# Patient Record
Sex: Female | Born: 1975 | Race: White | Hispanic: No | Marital: Married | Smoking: Never smoker
Health system: Southern US, Community
[De-identification: ages and names within clinical notes are randomized; demographics above are authoritative.]

## PROBLEM LIST (undated history)

## (undated) DIAGNOSIS — E042 Nontoxic multinodular goiter: Secondary | ICD-10-CM

## (undated) HISTORY — PX: DENTAL SURGERY: SHX609

## (undated) HISTORY — PX: DILATION AND CURETTAGE OF UTERUS: SHX78

## (undated) HISTORY — PX: CHOLECYSTECTOMY: SHX55

---

## 2009-11-21 ENCOUNTER — Inpatient Hospital Stay (HOSPITAL_COMMUNITY): Admission: AD | Admit: 2009-11-21 | Discharge: 2009-11-23 | Payer: Self-pay | Admitting: Obstetrics and Gynecology

## 2010-05-23 ENCOUNTER — Encounter: Admission: RE | Admit: 2010-05-23 | Discharge: 2010-05-23 | Payer: Self-pay | Admitting: Family Medicine

## 2010-10-08 LAB — CBC
Platelets: 175 10*3/uL (ref 150–400)
RBC: 3.23 MIL/uL — ABNORMAL LOW (ref 3.87–5.11)
WBC: 6.2 10*3/uL (ref 4.0–10.5)
WBC: 9.7 10*3/uL (ref 4.0–10.5)

## 2010-10-08 LAB — HEPATITIS B SURFACE ANTIGEN: Hepatitis B Surface Ag: NEGATIVE

## 2010-10-08 LAB — RPR: RPR Ser Ql: NONREACTIVE

## 2011-05-16 ENCOUNTER — Other Ambulatory Visit: Payer: Self-pay | Admitting: Family Medicine

## 2011-05-16 DIAGNOSIS — E041 Nontoxic single thyroid nodule: Secondary | ICD-10-CM

## 2011-05-23 ENCOUNTER — Ambulatory Visit
Admission: RE | Admit: 2011-05-23 | Discharge: 2011-05-23 | Disposition: A | Discharge status: Home / Self Care | Payer: 59 | Source: Ambulatory Visit | Attending: Family Medicine | Admitting: Family Medicine

## 2011-05-23 DIAGNOSIS — E041 Nontoxic single thyroid nodule: Secondary | ICD-10-CM

## 2012-03-08 ENCOUNTER — Other Ambulatory Visit: Payer: Self-pay | Admitting: Obstetrics and Gynecology

## 2012-03-08 MED ORDER — NORGESTIM-ETH ESTRAD TRIPHASIC 0.18/0.215/0.25 MG-25 MCG PO TABS: 1.0000 | ORAL_TABLET | Freq: Every day | ORAL | Status: DC

## 2012-03-08 NOTE — Telephone Encounter (Signed)
Spoke with pt rgd msg pt want refill on bc trisprintec until aex 04/21/12 per protocol rx sent to pharm pt voice understandong

## 2012-04-04 ENCOUNTER — Other Ambulatory Visit: Payer: Self-pay | Admitting: Obstetrics and Gynecology

## 2012-04-05 ENCOUNTER — Other Ambulatory Visit: Payer: Self-pay

## 2012-04-21 ENCOUNTER — Ambulatory Visit (INDEPENDENT_AMBULATORY_CARE_PROVIDER_SITE_OTHER): Payer: 59 | Admitting: Obstetrics and Gynecology

## 2012-04-21 ENCOUNTER — Encounter: Payer: Self-pay | Admitting: Obstetrics and Gynecology

## 2012-04-21 VITALS — BP 102/62 | HR 68 | Resp 16 | Ht 66.0 in | Wt 146.0 lb

## 2012-04-21 DIAGNOSIS — E042 Nontoxic multinodular goiter: Secondary | ICD-10-CM | POA: Insufficient documentation

## 2012-04-21 DIAGNOSIS — Z124 Encounter for screening for malignant neoplasm of cervix: Secondary | ICD-10-CM

## 2012-04-21 DIAGNOSIS — Z309 Encounter for contraceptive management, unspecified: Secondary | ICD-10-CM

## 2012-04-21 DIAGNOSIS — Z882 Allergy status to sulfonamides status: Secondary | ICD-10-CM | POA: Insufficient documentation

## 2012-04-21 DIAGNOSIS — IMO0001 Reserved for inherently not codable concepts without codable children: Secondary | ICD-10-CM | POA: Insufficient documentation

## 2012-04-21 MED ORDER — NORGESTIM-ETH ESTRAD TRIPHASIC 0.18/0.215/0.25 MG-35 MCG PO TABS: 1.0000 | ORAL_TABLET | Freq: Every day | ORAL | Status: AC

## 2012-04-21 NOTE — Progress Notes (Signed)
Regular Periods: yes Mammogram: no  Monthly Breast Ex.: yes Exercise: no  Tetanus < 10 years: yes Seatbelts: yes  NI. Bladder Functn.: yes Abuse at home: no  Daily BM's: yes Stressful Work: no  Healthy Diet: yes Sigmoid-Colonoscopy: None  Calcium: no Medical problems this year: None   LAST PAP:04/18/2011 WNL  Contraception: Trisprintec BC PILL  Mammogram:  None  PCP: [place]  PMH: None  FMH: None  Last Bone Scan: None

## 2012-04-21 NOTE — Progress Notes (Signed)
Subjective:    Alejandra Stewart is a 36 y.o. female, No obstetric history on file., who presents for an annual exam. Wants to continue TriSprintec.   Children in pre-K, kindergarten, and 4th grade.  Patient reports:  No issues.    History   Social History  . Marital Status: Married    Spouse Name: N/A    Number of Children: N/A  . Years of Education: N/A   Social History Main Topics  . Smoking status: Never Smoker   . Smokeless tobacco: None  . Alcohol Use: Yes  . Drug Use: No  . Sexually Active: Yes    Birth Control/ Protection: Pill   Other Topics Concern  . None   Social History Narrative  . None    Menstrual cycle:   LMP: Patient's last menstrual period was 03/31/2012.           Cycle: Normal  The following portions of the patient's history were reviewed and updated as appropriate: allergies, current medications, past family history, past medical history, past social history, past surgical history and problem list.  Review of Systems Pertinent items are noted in HPI. Breast:Negative for breast lump,nipple discharge or nipple retraction Gastrointestinal: Negative for abdominal pain, change in bowel habits or rectal bleeding Urinary:negative   Objective:    BP 102/62  Pulse 68  Resp 16  Ht 5\' 6"  (1.676 m)  Wt 146 lb (66.225 kg)  BMI 23.56 kg/m2  LMP 03/31/2012    Weight:  Wt Readings from Last 1 Encounters:  04/21/12 146 lb (66.225 kg)          BMI: Body mass index is 23.56 kg/(m^2).  General Appearance: Alert, appropriate appearance for age. No acute distress HEENT: Grossly normal Neck / Thyroid: Supple, no masses, nodes or enlargement Lungs: clear to auscultation bilaterally Back: No CVA tenderness Breast Exam: No masses or nodes.No dimpling, nipple retraction or discharge. Cardiovascular: Regular rate and rhythm. S1, S2, no murmur Gastrointestinal: Soft, non-tender, no masses or organomegaly Pelvic Exam: Vulva and vagina appear normal. Bimanual exam  reveals normal uterus and adnexa. Rectovaginal: normal rectal, no masses Lymphatic Exam: Non-palpable nodes in neck, clavicular, axillary, or inguinal regions Skin: no rash or abnormalities Neurologic: Normal gait and speech, no tremor  Psychiatric: Alert and oriented, appropriate affect.   Wet Prep:not applicable Urinalysis:not applicable UPT: Not done   Assessment:    Normal gyn exam  Contraceptive management--wants to continue TriSprintec   Plan:    Mammogram: Age 20 Pap:  Done today STD screening: declined Contraception:oral contraceptives (estrogen/progesterone) Other:  Rx Trisprintec x 1 year      Nikia Levels, [place], [place]

## 2012-04-22 LAB — PAP IG W/ RFLX HPV ASCU

## 2012-05-19 ENCOUNTER — Other Ambulatory Visit: Payer: Self-pay | Admitting: Family Medicine

## 2012-05-19 DIAGNOSIS — E041 Nontoxic single thyroid nodule: Secondary | ICD-10-CM

## 2012-06-02 ENCOUNTER — Other Ambulatory Visit: Payer: 59

## 2012-06-21 ENCOUNTER — Ambulatory Visit
Admission: RE | Admit: 2012-06-21 | Discharge: 2012-06-21 | Disposition: A | Discharge status: Home / Self Care | Payer: 59 | Source: Ambulatory Visit | Attending: Family Medicine | Admitting: Family Medicine

## 2012-06-21 DIAGNOSIS — E041 Nontoxic single thyroid nodule: Secondary | ICD-10-CM

## 2014-07-25 ENCOUNTER — Other Ambulatory Visit: Payer: Self-pay | Admitting: Physician Assistant

## 2014-07-25 DIAGNOSIS — E041 Nontoxic single thyroid nodule: Secondary | ICD-10-CM

## 2014-08-03 ENCOUNTER — Ambulatory Visit
Admission: RE | Admit: 2014-08-03 | Discharge: 2014-08-03 | Disposition: A | Discharge status: Home / Self Care | Payer: 59 | Source: Ambulatory Visit | Attending: Physician Assistant | Admitting: Physician Assistant

## 2014-08-03 DIAGNOSIS — E041 Nontoxic single thyroid nodule: Secondary | ICD-10-CM

## 2014-09-06 ENCOUNTER — Other Ambulatory Visit (INDEPENDENT_AMBULATORY_CARE_PROVIDER_SITE_OTHER): Payer: Self-pay | Admitting: Surgery

## 2014-09-06 ENCOUNTER — Other Ambulatory Visit (INDEPENDENT_AMBULATORY_CARE_PROVIDER_SITE_OTHER): Payer: Self-pay | Admitting: *Deleted

## 2014-09-06 DIAGNOSIS — E041 Nontoxic single thyroid nodule: Secondary | ICD-10-CM

## 2014-09-14 ENCOUNTER — Ambulatory Visit
Admission: RE | Admit: 2014-09-14 | Discharge: 2014-09-14 | Disposition: A | Discharge status: Home / Self Care | Payer: 59 | Source: Ambulatory Visit | Attending: Surgery | Admitting: Surgery

## 2014-09-14 ENCOUNTER — Other Ambulatory Visit (HOSPITAL_COMMUNITY)
Admission: RE | Admit: 2014-09-14 | Discharge: 2014-09-14 | Disposition: A | Discharge status: Home / Self Care | Payer: 59 | Source: Ambulatory Visit | Attending: Interventional Radiology | Admitting: Interventional Radiology

## 2014-09-14 DIAGNOSIS — E041 Nontoxic single thyroid nodule: Secondary | ICD-10-CM | POA: Diagnosis present

## 2014-10-02 ENCOUNTER — Ambulatory Visit (INDEPENDENT_AMBULATORY_CARE_PROVIDER_SITE_OTHER): Payer: Self-pay | Admitting: Surgery

## 2014-10-26 ENCOUNTER — Encounter (HOSPITAL_COMMUNITY): Payer: Self-pay

## 2014-10-26 ENCOUNTER — Encounter (HOSPITAL_COMMUNITY)
Admission: RE | Admit: 2014-10-26 | Discharge: 2014-10-26 | Disposition: A | Discharge status: Home / Self Care | Payer: 59 | Source: Ambulatory Visit | Attending: Surgery | Admitting: Surgery

## 2014-10-26 DIAGNOSIS — Z01812 Encounter for preprocedural laboratory examination: Secondary | ICD-10-CM | POA: Insufficient documentation

## 2014-10-26 HISTORY — DX: Nontoxic multinodular goiter: E04.2

## 2014-10-26 LAB — CBC
HCT: 38.9 % (ref 36.0–46.0)
Hemoglobin: 13.1 g/dL (ref 12.0–15.0)
MCH: 30.8 pg (ref 26.0–34.0)
MCHC: 33.7 g/dL (ref 30.0–36.0)
MCV: 91.5 fL (ref 78.0–100.0)
PLATELETS: 218 10*3/uL (ref 150–400)
RBC: 4.25 MIL/uL (ref 3.87–5.11)
RDW: 12.6 % (ref 11.5–15.5)
WBC: 5 10*3/uL (ref 4.0–10.5)

## 2014-10-26 LAB — HCG, SERUM, QUALITATIVE: Preg, Serum: NEGATIVE

## 2014-10-26 NOTE — Pre-Procedure Instructions (Signed)
Alejandra Stewart  10/26/2014   Your procedure is scheduled on:  April 18th, Monday   Report to [facility] Admitting at  9:00 AM.  Call this number if you have problems the morning of surgery: [phone]   Remember:   Do not eat food or drink liquids after midnight Sunday.   Take these medicines the morning of surgery with A SIP OF WATER: Nothing   Do not wear jewelry, make-up or nail polish.  Do not wear lotions, powders, or perfumes. You may NOT wear deodorant the day of surgery.  Do not shave underarms & legs 48 hours prior to surgery.    Do not bring valuables to the hospital.  [facility] is not responsible for any belongings or valuables.               Contacts, dentures or bridgework may not be worn into surgery.  Leave suitcase in the car. After surgery it may be brought to your room.  For patients admitted to the hospital, discharge time is determined by your treatment team.    Name and phone number of your driver:    Special Instructions: "Preparing for Surgery" instruction sheet.   Please read over the following fact sheets that you were given: Pain Booklet and Surgical Site Infection Prevention

## 2014-10-26 NOTE — Progress Notes (Signed)
No cardiac complaints, never has seen a cardiologist.

## 2014-11-15 ENCOUNTER — Encounter (HOSPITAL_COMMUNITY): Payer: Self-pay | Admitting: *Deleted

## 2014-11-15 DIAGNOSIS — D440 Neoplasm of uncertain behavior of thyroid gland: Secondary | ICD-10-CM | POA: Diagnosis present

## 2014-11-15 NOTE — H&P (Signed)
  General Surgery [facility]  Alejandra Stewart DOB: 1976/06/26 Married / Language: English / Race: White Female  History of Present Illness   The patient is a 39 year old female who presents with a thyroid nodule. Patient returns at my request to discuss the results of her fine-needle aspiration biopsies. 2 nodules in the left thyroid lobe underwent FNA aspiration. Cytopathology shows follicular lesions with cytologic atypia which are quite cellular. One specimen contains Hurthle cells. Both are Bethesda category III lesions. Patient notes some tenderness and bruising following the fine-needle aspiration procedure. That has now resolved. She returns today to discuss the pathology results.   Allergies Amoxicillin ER *PENICILLINS* Hives. Sulfa 10 *OPHTHALMIC AGENTS*  Medication History Ortho Tri-Cyclen (28) (0.18/0.215/0.25MG-35 MCG Tablet, Oral) Active. PreviDent 5000 Plus (1.1% Cream, Dental) Active. Medications Reconciled  Vitals 10/02/2014 9:25 AM Weight: 157.8 lb Height: 66in Body Surface Area: 1.83 m Body Mass Index: 25.47 kg/m Temp.: 98.39F  Pulse: 90 (Regular)  BP: 118/78 (Sitting, Left Arm, Standard)    Physical Exam  General - appears comfortable, no distress; not diaphorectic  HEENT - normocephalic; sclerae clear, gaze conjugate; mucous membranes moist, dentition good; voice normal  Neck - symmetric on extension; no palpable anterior or posterior cervical adenopathy; no palpable masses in the thyroid bed  Chest - clear bilaterally with rhonchi, rales, or wheeze  Cor - regular rhythm with normal rate; no significant murmur  Ext - non-tender without significant edema or lymphedema  Neuro - grossly intact; no tremor    Assessment & Plan  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 There is a nodule in the right  thyroid lobe which has not been sampled. Patient was previously provided with written literature on thyroid surgery to review at home.  I have recommended total thyroidectomy for definitive diagnosis and management. Patient and I discussed the procedure at length. We have discussed the surgical incision. We discussed possible complications including recurrent laryngeal nerve injury and injury to parathyroid glands. We have discussed the need for lifelong thyroid hormone replacement. We have discussed the hospital stay to be anticipated and the postoperative recovery. We have discussed the potential need for radioactive iodine treatment. Patient understands and wishes to proceed with surgery in the near future.  The risks and benefits of the procedure have been discussed at length with the patient. The patient understands the proposed procedure, potential alternative treatments, and the course of recovery to be expected. All of the patient's questions have been answered at this time. The patient wishes to proceed with surgery.  Earnstine Regal, MD, [facility] Office: [phone]

## 2014-11-16 ENCOUNTER — Observation Stay (HOSPITAL_COMMUNITY)
Admission: RE | Admit: 2014-11-16 | Discharge: 2014-11-17 | Disposition: A | Discharge status: Home / Self Care | Payer: 59 | Source: Ambulatory Visit | Attending: Surgery | Admitting: Surgery

## 2014-11-16 ENCOUNTER — Encounter (HOSPITAL_COMMUNITY): Payer: Self-pay | Admitting: *Deleted

## 2014-11-16 ENCOUNTER — Ambulatory Visit (HOSPITAL_COMMUNITY): Payer: 59 | Admitting: Anesthesiology

## 2014-11-16 ENCOUNTER — Ambulatory Visit (HOSPITAL_COMMUNITY): Payer: 59

## 2014-11-16 ENCOUNTER — Encounter (HOSPITAL_COMMUNITY)
Admission: RE | Disposition: A | Discharge status: Home / Self Care | Payer: Self-pay | Source: Ambulatory Visit | Attending: Surgery

## 2014-11-16 DIAGNOSIS — E042 Nontoxic multinodular goiter: Principal | ICD-10-CM | POA: Insufficient documentation

## 2014-11-16 DIAGNOSIS — Z01818 Encounter for other preprocedural examination: Secondary | ICD-10-CM

## 2014-11-16 DIAGNOSIS — E041 Nontoxic single thyroid nodule: Secondary | ICD-10-CM

## 2014-11-16 DIAGNOSIS — D440 Neoplasm of uncertain behavior of thyroid gland: Secondary | ICD-10-CM

## 2014-11-16 HISTORY — PX: THYROIDECTOMY: SHX17

## 2014-11-16 SURGERY — THYROIDECTOMY
Anesthesia: General | Site: Neck

## 2014-11-16 MED ORDER — HYDROCODONE-ACETAMINOPHEN 5-325 MG PO TABS
ORAL_TABLET | ORAL | Status: AC
  Filled 2014-11-16: qty 2

## 2014-11-16 MED ORDER — LACTATED RINGERS IV SOLN
INTRAVENOUS | Status: DC
  Administered 2014-11-16 (×2): via INTRAVENOUS

## 2014-11-16 MED ORDER — MIDAZOLAM HCL 2 MG/2ML IJ SOLN
INTRAMUSCULAR | Status: AC
  Filled 2014-11-16: qty 2

## 2014-11-16 MED ORDER — FENTANYL CITRATE (PF) 250 MCG/5ML IJ SOLN
INTRAMUSCULAR | Status: AC
  Filled 2014-11-16: qty 5

## 2014-11-16 MED ORDER — HYDROMORPHONE HCL 1 MG/ML IJ SOLN
INTRAMUSCULAR | Status: AC
  Filled 2014-11-16: qty 1

## 2014-11-16 MED ORDER — HYDROMORPHONE HCL 1 MG/ML IJ SOLN
0.2500 mg | INTRAMUSCULAR | Status: DC | PRN
  Administered 2014-11-16 (×4): 0.5 mg via INTRAVENOUS

## 2014-11-16 MED ORDER — LIDOCAINE HCL (CARDIAC) 20 MG/ML IV SOLN
INTRAVENOUS | Status: DC | PRN
  Administered 2014-11-16: 100 mg via INTRAVENOUS

## 2014-11-16 MED ORDER — HYDROMORPHONE HCL 1 MG/ML IJ SOLN
1.0000 mg | INTRAMUSCULAR | Status: DC | PRN
  Administered 2014-11-16 – 2014-11-17 (×4): 1 mg via INTRAVENOUS
  Filled 2014-11-16 (×3): qty 1

## 2014-11-16 MED ORDER — FENTANYL CITRATE (PF) 100 MCG/2ML IJ SOLN
INTRAMUSCULAR | Status: DC | PRN
  Administered 2014-11-16: 50 ug via INTRAVENOUS
  Administered 2014-11-16: 100 ug via INTRAVENOUS
  Administered 2014-11-16: 50 ug via INTRAVENOUS
  Administered 2014-11-16: 100 ug via INTRAVENOUS

## 2014-11-16 MED ORDER — GLYCOPYRROLATE 0.2 MG/ML IJ SOLN
INTRAMUSCULAR | Status: DC | PRN
  Administered 2014-11-16: .8 mg via INTRAVENOUS

## 2014-11-16 MED ORDER — HYDROCODONE-ACETAMINOPHEN 5-325 MG PO TABS
1.0000 | ORAL_TABLET | ORAL | Status: DC | PRN
Start: 2014-11-16 — End: 2014-11-17
  Administered 2014-11-16 – 2014-11-17 (×4): 2 via ORAL
  Filled 2014-11-16 (×3): qty 2

## 2014-11-16 MED ORDER — 0.9 % SODIUM CHLORIDE (POUR BTL) OPTIME
TOPICAL | Status: DC | PRN
  Administered 2014-11-16: 1000 mL

## 2014-11-16 MED ORDER — NEOSTIGMINE METHYLSULFATE 10 MG/10ML IV SOLN
INTRAVENOUS | Status: DC | PRN
  Administered 2014-11-16: 4 mg via INTRAVENOUS

## 2014-11-16 MED ORDER — HEMOSTATIC AGENTS (NO CHARGE) OPTIME
TOPICAL | Status: DC | PRN
  Administered 2014-11-16: 1 via TOPICAL

## 2014-11-16 MED ORDER — PROMETHAZINE HCL 25 MG/ML IJ SOLN
INTRAMUSCULAR | Status: AC
  Filled 2014-11-16: qty 1

## 2014-11-16 MED ORDER — ACETAMINOPHEN 325 MG PO TABS
650.0000 mg | ORAL_TABLET | ORAL | Status: DC | PRN
Start: 2014-11-16 — End: 2014-11-17

## 2014-11-16 MED ORDER — MIDAZOLAM HCL 5 MG/5ML IJ SOLN
INTRAMUSCULAR | Status: DC | PRN
  Administered 2014-11-16: 2 mg via INTRAVENOUS

## 2014-11-16 MED ORDER — PROMETHAZINE HCL 25 MG/ML IJ SOLN
6.2500 mg | INTRAMUSCULAR | Status: DC | PRN
  Administered 2014-11-16: 12.5 mg via INTRAVENOUS

## 2014-11-16 MED ORDER — CALCIUM CARBONATE 1250 (500 CA) MG PO TABS
2.0000 | ORAL_TABLET | Freq: Three times a day (TID) | ORAL | Status: DC
  Administered 2014-11-16 – 2014-11-17 (×3): 1000 mg via ORAL
  Filled 2014-11-16 (×3): qty 2

## 2014-11-16 MED ORDER — PROPOFOL 10 MG/ML IV BOLUS
INTRAVENOUS | Status: AC
  Filled 2014-11-16: qty 20

## 2014-11-16 MED ORDER — PROPOFOL 10 MG/ML IV BOLUS
INTRAVENOUS | Status: DC | PRN
  Administered 2014-11-16: 150 mg via INTRAVENOUS

## 2014-11-16 MED ORDER — ONDANSETRON HCL 4 MG/2ML IJ SOLN
INTRAMUSCULAR | Status: DC | PRN
  Administered 2014-11-16: 4 mg via INTRAVENOUS

## 2014-11-16 MED ORDER — KCL IN DEXTROSE-NACL 20-5-0.45 MEQ/L-%-% IV SOLN
INTRAVENOUS | Status: DC
  Administered 2014-11-16: 19:00:00 via INTRAVENOUS
  Filled 2014-11-16 (×2): qty 1000

## 2014-11-16 MED ORDER — CIPROFLOXACIN IN D5W 400 MG/200ML IV SOLN
400.0000 mg | INTRAVENOUS | Status: AC
  Administered 2014-11-16: 400 mg via INTRAVENOUS
  Filled 2014-11-16: qty 200

## 2014-11-16 MED ORDER — DEXAMETHASONE SODIUM PHOSPHATE 10 MG/ML IJ SOLN
INTRAMUSCULAR | Status: DC | PRN
  Administered 2014-11-16: 8 mg via INTRAVENOUS

## 2014-11-16 MED ORDER — ONDANSETRON HCL 4 MG PO TABS: 4.0000 mg | ORAL_TABLET | Freq: Four times a day (QID) | ORAL | Status: DC | PRN

## 2014-11-16 MED ORDER — ROCURONIUM BROMIDE 100 MG/10ML IV SOLN
INTRAVENOUS | Status: DC | PRN
  Administered 2014-11-16: 40 mg via INTRAVENOUS

## 2014-11-16 MED ORDER — ONDANSETRON HCL 4 MG/2ML IJ SOLN: 4.0000 mg | Freq: Four times a day (QID) | INTRAMUSCULAR | Status: DC | PRN

## 2014-11-16 SURGICAL SUPPLY — 49 items
ATTRACTOMAT 16X20 MAGNETIC DRP (DRAPES) ×2 IMPLANT
BENZOIN TINCTURE PRP APPL 2/3 (GAUZE/BANDAGES/DRESSINGS) ×2 IMPLANT
BLADE SURG 10 STRL SS (BLADE) ×2 IMPLANT
BLADE SURG 15 STRL LF DISP TIS (BLADE) ×1 IMPLANT
BLADE SURG 15 STRL SS (BLADE) ×1
BLADE SURG ROTATE 9660 (MISCELLANEOUS) IMPLANT
CANISTER SUCTION 2500CC (MISCELLANEOUS) ×2 IMPLANT
CHLORAPREP W/TINT 10.5 ML (MISCELLANEOUS) ×2 IMPLANT
CLIP TI MEDIUM 24 (CLIP) ×2 IMPLANT
CLIP TI WIDE RED SMALL 24 (CLIP) ×2 IMPLANT
CONT SPEC 4OZ CLIKSEAL STRL BL (MISCELLANEOUS) IMPLANT
COVER SURGICAL LIGHT HANDLE (MISCELLANEOUS) ×2 IMPLANT
CRADLE DONUT ADULT HEAD (MISCELLANEOUS) ×2 IMPLANT
DRAPE PED LAPAROTOMY (DRAPES) ×2 IMPLANT
DRAPE UTILITY XL STRL (DRAPES) ×2 IMPLANT
ELECT CAUTERY BLADE 6.4 (BLADE) ×2 IMPLANT
ELECT REM PT RETURN 9FT ADLT (ELECTROSURGICAL) ×2
ELECTRODE REM PT RTRN 9FT ADLT (ELECTROSURGICAL) ×1 IMPLANT
GAUZE SPONGE 4X4 12PLY STRL (GAUZE/BANDAGES/DRESSINGS) ×2 IMPLANT
GAUZE SPONGE 4X4 16PLY XRAY LF (GAUZE/BANDAGES/DRESSINGS) ×2 IMPLANT
GLOVE BIO SURGEON STRL SZ7.5 (GLOVE) ×4 IMPLANT
GLOVE BIOGEL PI IND STRL 7.5 (GLOVE) ×1 IMPLANT
GLOVE BIOGEL PI INDICATOR 7.5 (GLOVE) ×1
GLOVE SURG ORTHO 8.0 STRL STRW (GLOVE) ×2 IMPLANT
GOWN STRL REUS W/ TWL LRG LVL3 (GOWN DISPOSABLE) ×2 IMPLANT
GOWN STRL REUS W/ TWL XL LVL3 (GOWN DISPOSABLE) ×1 IMPLANT
GOWN STRL REUS W/TWL LRG LVL3 (GOWN DISPOSABLE) ×2
GOWN STRL REUS W/TWL XL LVL3 (GOWN DISPOSABLE) ×1
HEMOSTAT SURGICEL 2X4 FIBR (HEMOSTASIS) ×2 IMPLANT
KIT BASIN OR (CUSTOM PROCEDURE TRAY) ×2 IMPLANT
KIT ROOM TURNOVER OR (KITS) ×2 IMPLANT
LIQUID BAND (GAUZE/BANDAGES/DRESSINGS) ×2 IMPLANT
NS IRRIG 1000ML POUR BTL (IV SOLUTION) ×2 IMPLANT
PACK SURGICAL SETUP 50X90 (CUSTOM PROCEDURE TRAY) ×2 IMPLANT
PAD ARMBOARD 7.5X6 YLW CONV (MISCELLANEOUS) ×2 IMPLANT
PENCIL BUTTON HOLSTER BLD 10FT (ELECTRODE) ×2 IMPLANT
SHEARS HARMONIC 9CM CVD (BLADE) ×2 IMPLANT
SPECIMEN JAR MEDIUM (MISCELLANEOUS) IMPLANT
SPONGE GAUZE 4X4 12PLY STER LF (GAUZE/BANDAGES/DRESSINGS) ×2 IMPLANT
SPONGE INTESTINAL PEANUT (DISPOSABLE) ×2 IMPLANT
STRIP CLOSURE SKIN 1/2X4 (GAUZE/BANDAGES/DRESSINGS) ×2 IMPLANT
SUT MNCRL AB 4-0 PS2 18 (SUTURE) ×2 IMPLANT
SUT SILK 2 0 (SUTURE) ×1
SUT SILK 2-0 18XBRD TIE 12 (SUTURE) ×1 IMPLANT
SUT VIC AB 3-0 SH 18 (SUTURE) ×2 IMPLANT
SYR BULB 3OZ (MISCELLANEOUS) ×4 IMPLANT
TOWEL OR 17X24 6PK STRL BLUE (TOWEL DISPOSABLE) ×2 IMPLANT
TOWEL OR 17X26 10 PK STRL BLUE (TOWEL DISPOSABLE) ×2 IMPLANT
TUBE CONNECTING 12X1/4 (SUCTIONS) ×2 IMPLANT

## 2014-11-16 NOTE — Anesthesia Procedure Notes (Signed)
Procedure Name: Intubation Date/Time: 11/16/2014 9:44 AM Performed by: Neldon Newport Pre-anesthesia Checklist: Patient being monitored, Suction available, Emergency Drugs available, Patient identified and Timeout performed Patient Re-evaluated:Patient Re-evaluated prior to inductionOxygen Delivery Method: Circle system utilized Preoxygenation: Pre-oxygenation with 100% oxygen Intubation Type: IV induction Ventilation: Mask ventilation without difficulty Laryngoscope Size: Mac and 3 Grade View: Grade I Tube type: Oral Tube size: 7.0 mm Number of attempts: 1 Placement Confirmation: positive ETCO2,  ETT inserted through vocal cords under direct vision and breath sounds checked- equal and bilateral Secured at: 22 cm Tube secured with: Tape Dental Injury: Teeth and Oropharynx as per pre-operative assessment

## 2014-11-16 NOTE — Interval H&P Note (Signed)
History and Physical Interval Note:  11/16/2014 9:11 AM  Alejandra Stewart  has presented today for surgery, with the diagnosis of THYROID NEOPLASM OF UNCERTAIN BEHAVIOR.  The various methods of treatment have been discussed with the patient and family. After consideration of risks, benefits and other options for treatment, the patient has consented to    Procedure(s): TOTAL THYROIDECTOMY (N/A) as a surgical intervention .    The patient's history has been reviewed, patient examined, no change in status, stable for surgery.  I have reviewed the patient's chart and labs.  Questions were answered to the patient's satisfaction.    Earnstine Regal, MD, [facility] Office: [place]

## 2014-11-16 NOTE — Op Note (Signed)
Procedure Note  Pre-operative Diagnosis:  Bilateral thyroid nodules, thyroid neoplasm of uncertain behavior  Post-operative Diagnosis:  same  Surgeon:  Earnstine Regal, MD, FACS  Assistant:  Sharyn Dross, RNFA   Procedure:  Total thyroidectomy  Anesthesia:  General  Estimated Blood Loss:  minimal  Drains: none         Specimen: thyroid to pathology  Indications:  The patient is a 39 year old female who presents with a thyroid nodule. Patient returns at my request to discuss the results of her fine-needle aspiration biopsies. 2 nodules in the left thyroid lobe underwent FNA aspiration. Cytopathology shows follicular lesions with cytologic atypia which are quite cellular. One specimen contains Hurthle cells. Both are Bethesda category III lesions.   Procedure Details: Procedure was done in OR #8 at the [facility].  The patient was brought to the operating room and placed in a supine position on the operating room table.  Following administration of general anesthesia, the patient was positioned and then prepped and draped in the usual aseptic fashion.  After ascertaining that an adequate level of anesthesia had been achieved, a Kocher incision was made with #15 blade.  Dissection was carried through subcutaneous tissues and platysma. Hemostasis was achieved with the electrocautery.  Skin flaps were elevated cephalad and caudad from the thyroid notch to the sternal notch.  The Mahorner self-retaining retractor was placed for exposure.  Strap muscles were incised in the midline and dissection was begun on the left side.  Strap muscles were reflected laterally.  Left thyroid lobe was nodular but normal in size.  The left lobe was gently mobilized with blunt dissection.  Superior pole vessels were dissected out and divided individually between small and medium Ligaclips with the Harmonic scalpel.  The thyroid lobe was rolled anteriorly.  Branches of the inferior thyroid artery were  divided between small Ligaclips with the Harmonic scalpel.  Inferior venous tributaries were divided between Ligaclips.  Both the superior and inferior parathyroid glands were identified and preserved on their vascular pedicles.  The recurrent laryngeal nerve was identified and preserved along its course.  The ligament of Gwenlyn Found was released with the electrocautery and the gland was mobilized onto the anterior trachea. Isthmus was mobilized across the midline.  There was a thin pyramidal lobe present.  This was resected en bloc with the isthmus.  Dry pack was placed in the left neck.  Next, the right thyroid lobe was gently mobilized with blunt dissection.  Right thyroid lobe was nodular but normal in size.  Superior pole vessels were dissected out and divided between small and medium Ligaclips with the Harmonic scalpel.  Superior parathyroid was identified and preserved.  Inferior venous tributaries were divided between medium Ligaclips with the Harmonic scalpel.  The right thyroid lobe was rolled anteriorly and the branches of the inferior thyroid artery divided between small Ligaclips.  The right recurrent laryngeal nerve was identified and preserved along its course.  The ligament of Gwenlyn Found was released with the electrocautery.  The right thyroid lobe was mobilized onto the anterior trachea and the remainder of the thyroid was dissected off the anterior trachea and the thyroid was completely excised.  A suture was used to mark the left superior pole. The entire thyroid gland was submitted to pathology for review.  The neck was irrigated with warm saline.  Fibular was placed throughout the operative field.  Strap muscles were reapproximated in the midline with interrupted 3-0 Vicryl sutures.  Platysma was closed  with interrupted 3-0 Vicryl sutures.  Skin was closed with a running 4-0 Monocryl subcuticular suture.  Wound was washed and dried and benzoin and steri-strips were applied.  Dry gauze dressing was  placed.  The patient was awakened from anesthesia and brought to the recovery room.  The patient tolerated the procedure well.   Earnstine Regal, MD, [facility] Office: [phone]

## 2014-11-16 NOTE — Transfer of Care (Signed)
Immediate Anesthesia Transfer of Care Note  Patient: Alejandra Stewart  Procedure(s) Performed: Procedure(s): TOTAL THYROIDECTOMY (N/A)  Patient Location: PACU  Anesthesia Type:General  Level of Consciousness: awake and alert   Airway & Oxygen Therapy: Patient Spontanous Breathing and Patient connected to nasal cannula oxygen  Post-op Assessment: Report given to RN and Post -op Vital signs reviewed and stable  Post vital signs: Reviewed and stable  Last Vitals:  Filed Vitals:   11/16/14 0802  BP: 132/79  Pulse: 88  Temp: 36.7 C  Resp: 20    Complications: No apparent anesthesia complications

## 2014-11-16 NOTE — Anesthesia Preprocedure Evaluation (Addendum)
Anesthesia Evaluation  Patient identified by MRN, date of birth, ID band Patient awake    Reviewed: Allergy & Precautions, H&P , NPO status , Patient's Chart, lab work & pertinent test results  Airway Mallampati: II  TM Distance: <3 FB Neck ROM: full    Dental no notable dental hx. (+) Teeth Intact, Dental Advidsory Given   Pulmonary neg pulmonary ROS,  breath sounds clear to auscultation  Pulmonary exam normal       Cardiovascular negative cardio ROS  Rhythm:Regular Rate:Normal     Neuro/Psych negative neurological ROS     GI/Hepatic negative GI ROS, Neg liver ROS,   Endo/Other  Thyroid history noted. CE  Renal/GU negative Renal ROS  negative genitourinary   Musculoskeletal   Abdominal Normal abdominal exam  (+)   Peds  Hematology   Anesthesia Other Findings   Reproductive/Obstetrics                            Anesthesia Physical Anesthesia Plan  ASA: II  Anesthesia Plan: General   Post-op Pain Management:    Induction: Intravenous  Airway Management Planned: Oral ETT  Additional Equipment:   Intra-op Plan:   Post-operative Plan: Extubation in OR  Informed Consent: I have reviewed the patients History and Physical, chart, labs and discussed the procedure including the risks, benefits and alternatives for the proposed anesthesia with the patient or authorized representative who has indicated his/her understanding and acceptance.   Dental Advisory Given and Dental advisory given  Plan Discussed with: Anesthesiologist, CRNA and Surgeon  Anesthesia Plan Comments:        Anesthesia Quick Evaluation

## 2014-11-16 NOTE — Anesthesia Postprocedure Evaluation (Signed)
  Anesthesia Post-op Note  Patient: Alejandra Stewart  Procedure(s) Performed: Procedure(s): TOTAL THYROIDECTOMY (N/A)  Patient Location: PACU  Anesthesia Type:General  Level of Consciousness: awake  Airway and Oxygen Therapy: Patient Spontanous Breathing  Post-op Pain: mild  Post-op Assessment: Post-op Vital signs reviewed  Post-op Vital Signs: Reviewed  Last Vitals:  Filed Vitals:   11/16/14 1200  BP: 118/73  Pulse: 104  Temp:   Resp: 21    Complications: No apparent anesthesia complications

## 2014-11-17 ENCOUNTER — Encounter (HOSPITAL_COMMUNITY): Payer: Self-pay | Admitting: Surgery

## 2014-11-17 DIAGNOSIS — E042 Nontoxic multinodular goiter: Secondary | ICD-10-CM | POA: Diagnosis not present

## 2014-11-17 LAB — BASIC METABOLIC PANEL
ANION GAP: 6 (ref 5–15)
CALCIUM: 8.1 mg/dL — AB (ref 8.4–10.5)
CO2: 28 mmol/L (ref 19–32)
Chloride: 104 mmol/L (ref 96–112)
Creatinine, Ser: 0.63 mg/dL (ref 0.50–1.10)
GLUCOSE: 111 mg/dL — AB (ref 70–99)
Potassium: 3.6 mmol/L (ref 3.5–5.1)
Sodium: 138 mmol/L (ref 135–145)

## 2014-11-17 MED ORDER — SYNTHROID 100 MCG PO TABS: 100.0000 ug | ORAL_TABLET | Freq: Every day | ORAL | Status: AC

## 2014-11-17 MED ORDER — CALCIUM CARBONATE 1250 (500 CA) MG PO TABS: 2.0000 | ORAL_TABLET | Freq: Three times a day (TID) | ORAL | Status: DC

## 2014-11-17 MED ORDER — SODIUM CHLORIDE 0.9 % IV SOLN
2.0000 g | INTRAVENOUS | Status: AC
  Administered 2014-11-17: 2 g via INTRAVENOUS
  Filled 2014-11-17: qty 20

## 2014-11-17 MED ORDER — OXYCODONE HCL 5 MG PO TABS: 5.0000 mg | ORAL_TABLET | Freq: Four times a day (QID) | ORAL | Status: DC | PRN

## 2014-11-17 NOTE — Progress Notes (Signed)
UR completed 

## 2014-11-17 NOTE — Progress Notes (Signed)
Quick Note:  Please contact patient and notify of benign pathology results.  Earnstine Regal, MD, [facility] Office: [phone]   ______

## 2014-11-17 NOTE — Discharge Summary (Signed)
Physician Discharge Summary [facility]  Patient ID: Alejandra Stewart MRN: [id number] DOB/AGE: [birth year] 38 y.o.  Admit date: 11/16/2014 Discharge date: 11/17/2014  Admission Diagnoses:  Thyroid neoplasm of uncertain behavior  Discharge Diagnoses:  Principal Problem:   Neoplasm of uncertain behavior of thyroid gland   Discharged Condition: good  Hospital Course: Patient was admitted for observation following thyroid surgery.  Post op course was uncomplicated.  Pain was well controlled.  Tolerated diet.  Post op calcium level on morning following surgery was 8.1 mg/dl.  Calcium gluconate 2 gm was given IV prior to discharge.  Patient was prepared for discharge home on POD#1.  Consults: None  Treatments: surgery: total thyroidectomy  Discharge Exam: Blood pressure 106/74, pulse 84, temperature 98.4 F (36.9 C), temperature source Oral, resp. rate 18, height 5' 6"  (1.676 m), weight 71.215 kg (157 lb), last menstrual period 11/06/2014, SpO2 100 %. HEENT - clear Neck - wound dry and intact; voice normal; mild soft tissue swelling Chest - clear bilaterally Cor - RRR  Disposition: Home  Discharge Instructions    Apply dressing    Complete by:  As directed   Apply light gauze dressing to wound before discharge home today.     Diet - low sodium heart healthy    Complete by:  As directed      Discharge instructions    Complete by:  As directed   [facility]  THYROID & PARATHYROID SURGERY:  POST-OP INSTRUCTIONS  Always review your discharge instruction sheet from the facility where your surgery was performed.  A prescription for pain medication may be given to you upon discharge.  Take your pain medication as prescribed.  If narcotic pain medicine is not needed, then you may take acetaminophen (Tylenol) or ibuprofen (Advil) as needed.  Take your usually prescribed medications unless otherwise directed.  If you need a refill on your pain  medication, please contact your pharmacy. They will contact our office to request authorization.  Prescriptions will not be processed by our office after 5 pm or on weekends.  Start with a light diet upon arrival home, such as soup and crackers or toast.  Be sure to drink plenty of fluids daily.  Resume your normal diet the day after surgery.  Most patients will experience some swelling and bruising on the chest and neck area.  Ice packs will help.  Swelling and bruising can take several days to resolve.   It is common to experience some constipation after surgery.  Increasing fluid intake and taking a stool softener will usually help or prevent this problem.  A mild laxative (Milk of Magnesia or Miralax) should be taken according to package directions if there has been no bowel movement after 48 hours.  You have steri-strips and a gauze dressing over your incision.  You may remove the gauze bandage on the second day after surgery, and you may shower at that time.  Leave your steri-strips (small skin tapes) in place directly over the incision.  These strips should remain on the skin for 5-7 days and then be removed.  You may get them wet in the shower and pat them dry.  You may resume regular (light) daily activities beginning the next day - such as daily self-care, walking, climbing stairs - gradually increasing activities as tolerated.  You may have sexual intercourse when it is comfortable.  Refrain from any heavy lifting or straining until approved by your doctor.  You may drive when  you no longer are taking prescription pain medication, you can comfortably wear a seatbelt, and you can safely maneuver your car and apply brakes.  You should see your doctor in the office for a follow-up appointment approximately two to three weeks after your surgery.  Make sure that you call for this appointment within a day or two after you arrive home to insure a convenient appointment time.  WHEN TO CALL YOUR  DOCTOR: -- Fever greater than 101.5 -- Inability to urinate -- Nausea and/or vomiting - persistent -- Extreme swelling or bruising -- Continued bleeding from incision -- Increased pain, redness, or drainage from the incision -- Difficulty swallowing or breathing -- Muscle cramping or spasms -- Numbness or tingling in hands or around lips  The clinic staff is available to answer your questions during regular business hours.  Please don't hesitate to call and ask to speak to one of the nurses if you have concerns.  Earnstine Regal, MD, [facility] Office: [phone]  Website: [email or web address]     Increase activity slowly    Complete by:  As directed      Remove dressing in 24 hours    Complete by:  As directed             Medication List    TAKE these medications        acetaminophen 500 MG tablet  Commonly known as:  TYLENOL  Take 500-1,000 mg by mouth every 6 (six) hours as needed for mild pain or moderate pain.     calcium carbonate 1250 (500 CA) MG tablet  Commonly known as:  OS-CAL - dosed in mg of elemental calcium  Take 2 tablets (1,000 mg of elemental calcium total) by mouth 3 (three) times daily.     Norgestimate-Ethinyl Estradiol Triphasic 0.18/0.215/0.25 MG-35 MCG tablet  Commonly known as:  TRI-SPRINTEC  Take 1 tablet by mouth daily.     oxyCODONE 5 MG immediate release tablet  Commonly known as:  Oxy IR/ROXICODONE  Take 1-2 tablets (5-10 mg total) by mouth every 6 (six) hours as needed for moderate pain.     SYNTHROID 100 MCG tablet  Generic drug:  levothyroxine  Take 1 tablet (100 mcg total) by mouth daily.           Follow-up Information    Follow up with Earnstine Regal, MD. Schedule an appointment as soon as possible for a visit in 3 weeks.   Specialty:  General Surgery   Why:  For wound re-check   Contact information:   [place]  [phone]       Earnstine Regal, MD, [facility] Office: [phone]   Signed: Earnstine Regal 11/17/2014, 10:08 AM

## 2015-12-05 DIAGNOSIS — E890 Postprocedural hypothyroidism: Secondary | ICD-10-CM | POA: Diagnosis not present

## 2015-12-12 DIAGNOSIS — E890 Postprocedural hypothyroidism: Secondary | ICD-10-CM | POA: Diagnosis not present

## 2015-12-14 DIAGNOSIS — M5417 Radiculopathy, lumbosacral region: Secondary | ICD-10-CM | POA: Diagnosis not present

## 2015-12-14 DIAGNOSIS — M6281 Muscle weakness (generalized): Secondary | ICD-10-CM | POA: Diagnosis not present

## 2015-12-14 DIAGNOSIS — M545 Low back pain: Secondary | ICD-10-CM | POA: Diagnosis not present

## 2015-12-14 DIAGNOSIS — S32000A Wedge compression fracture of unspecified lumbar vertebra, initial encounter for closed fracture: Secondary | ICD-10-CM | POA: Diagnosis not present

## 2015-12-18 ENCOUNTER — Other Ambulatory Visit: Payer: Self-pay | Admitting: Physician Assistant

## 2015-12-18 DIAGNOSIS — M5416 Radiculopathy, lumbar region: Secondary | ICD-10-CM

## 2015-12-25 ENCOUNTER — Ambulatory Visit
Admission: RE | Admit: 2015-12-25 | Discharge: 2015-12-25 | Disposition: A | Discharge status: Home / Self Care | Payer: BLUE CROSS/BLUE SHIELD | Source: Ambulatory Visit | Attending: Physician Assistant | Admitting: Physician Assistant

## 2015-12-25 DIAGNOSIS — M5416 Radiculopathy, lumbar region: Secondary | ICD-10-CM

## 2015-12-25 DIAGNOSIS — M5116 Intervertebral disc disorders with radiculopathy, lumbar region: Secondary | ICD-10-CM | POA: Diagnosis not present

## 2015-12-25 MED ORDER — GADOBENATE DIMEGLUMINE 529 MG/ML IV SOLN
14.0000 mL | Freq: Once | INTRAVENOUS | Status: AC | PRN
  Administered 2015-12-25: 14 mL via INTRAVENOUS

## 2016-03-21 DIAGNOSIS — M25571 Pain in right ankle and joints of right foot: Secondary | ICD-10-CM | POA: Diagnosis not present

## 2016-05-25 DIAGNOSIS — J209 Acute bronchitis, unspecified: Secondary | ICD-10-CM | POA: Diagnosis not present

## 2016-06-09 DIAGNOSIS — Z23 Encounter for immunization: Secondary | ICD-10-CM | POA: Diagnosis not present

## 2016-08-01 DIAGNOSIS — Z0000 Encounter for general adult medical examination without abnormal findings: Secondary | ICD-10-CM | POA: Diagnosis not present

## 2016-08-01 DIAGNOSIS — E039 Hypothyroidism, unspecified: Secondary | ICD-10-CM | POA: Diagnosis not present

## 2016-08-18 DIAGNOSIS — Z6825 Body mass index (BMI) 25.0-25.9, adult: Secondary | ICD-10-CM | POA: Diagnosis not present

## 2016-08-18 DIAGNOSIS — Z309 Encounter for contraceptive management, unspecified: Secondary | ICD-10-CM | POA: Diagnosis not present

## 2016-08-18 DIAGNOSIS — Z01419 Encounter for gynecological examination (general) (routine) without abnormal findings: Secondary | ICD-10-CM | POA: Diagnosis not present

## 2016-09-15 DIAGNOSIS — Z1231 Encounter for screening mammogram for malignant neoplasm of breast: Secondary | ICD-10-CM | POA: Diagnosis not present

## 2016-11-18 DIAGNOSIS — L308 Other specified dermatitis: Secondary | ICD-10-CM | POA: Diagnosis not present

## 2016-12-09 DIAGNOSIS — E890 Postprocedural hypothyroidism: Secondary | ICD-10-CM | POA: Diagnosis not present

## 2016-12-16 DIAGNOSIS — E890 Postprocedural hypothyroidism: Secondary | ICD-10-CM | POA: Diagnosis not present

## 2017-08-10 DIAGNOSIS — Z23 Encounter for immunization: Secondary | ICD-10-CM | POA: Diagnosis not present

## 2017-08-10 DIAGNOSIS — Z0000 Encounter for general adult medical examination without abnormal findings: Secondary | ICD-10-CM | POA: Diagnosis not present

## 2017-08-16 DIAGNOSIS — J069 Acute upper respiratory infection, unspecified: Secondary | ICD-10-CM | POA: Diagnosis not present

## 2017-08-16 DIAGNOSIS — R05 Cough: Secondary | ICD-10-CM | POA: Diagnosis not present

## 2017-08-17 DIAGNOSIS — Z01419 Encounter for gynecological examination (general) (routine) without abnormal findings: Secondary | ICD-10-CM | POA: Diagnosis not present

## 2017-08-17 DIAGNOSIS — Z6825 Body mass index (BMI) 25.0-25.9, adult: Secondary | ICD-10-CM | POA: Diagnosis not present

## 2017-08-17 DIAGNOSIS — Z3040 Encounter for surveillance of contraceptives, unspecified: Secondary | ICD-10-CM | POA: Diagnosis not present

## 2017-09-17 DIAGNOSIS — Z1231 Encounter for screening mammogram for malignant neoplasm of breast: Secondary | ICD-10-CM | POA: Diagnosis not present

## 2018-01-11 DIAGNOSIS — E890 Postprocedural hypothyroidism: Secondary | ICD-10-CM | POA: Diagnosis not present

## 2018-01-25 DIAGNOSIS — E890 Postprocedural hypothyroidism: Secondary | ICD-10-CM | POA: Diagnosis not present

## 2018-03-29 DIAGNOSIS — E890 Postprocedural hypothyroidism: Secondary | ICD-10-CM | POA: Diagnosis not present

## 2018-08-24 DIAGNOSIS — Z01419 Encounter for gynecological examination (general) (routine) without abnormal findings: Secondary | ICD-10-CM | POA: Diagnosis not present

## 2018-08-24 DIAGNOSIS — Z3040 Encounter for surveillance of contraceptives, unspecified: Secondary | ICD-10-CM | POA: Diagnosis not present

## 2018-08-24 DIAGNOSIS — Z124 Encounter for screening for malignant neoplasm of cervix: Secondary | ICD-10-CM | POA: Diagnosis not present

## 2018-08-24 DIAGNOSIS — Z6826 Body mass index (BMI) 26.0-26.9, adult: Secondary | ICD-10-CM | POA: Diagnosis not present

## 2018-09-02 DIAGNOSIS — Z833 Family history of diabetes mellitus: Secondary | ICD-10-CM | POA: Diagnosis not present

## 2018-09-02 DIAGNOSIS — E039 Hypothyroidism, unspecified: Secondary | ICD-10-CM | POA: Diagnosis not present

## 2018-09-02 DIAGNOSIS — Z0000 Encounter for general adult medical examination without abnormal findings: Secondary | ICD-10-CM | POA: Diagnosis not present

## 2018-09-02 DIAGNOSIS — Z131 Encounter for screening for diabetes mellitus: Secondary | ICD-10-CM | POA: Diagnosis not present

## 2018-09-21 DIAGNOSIS — Z1231 Encounter for screening mammogram for malignant neoplasm of breast: Secondary | ICD-10-CM | POA: Diagnosis not present

## 2019-01-13 DIAGNOSIS — R05 Cough: Secondary | ICD-10-CM | POA: Diagnosis not present

## 2019-01-14 DIAGNOSIS — R05 Cough: Secondary | ICD-10-CM | POA: Diagnosis not present

## 2019-01-31 DIAGNOSIS — E890 Postprocedural hypothyroidism: Secondary | ICD-10-CM | POA: Diagnosis not present

## 2019-02-03 DIAGNOSIS — E890 Postprocedural hypothyroidism: Secondary | ICD-10-CM | POA: Diagnosis not present

## 2019-03-21 DIAGNOSIS — M542 Cervicalgia: Secondary | ICD-10-CM | POA: Diagnosis not present

## 2019-05-09 DIAGNOSIS — E890 Postprocedural hypothyroidism: Secondary | ICD-10-CM | POA: Diagnosis not present

## 2019-05-17 DIAGNOSIS — Z23 Encounter for immunization: Secondary | ICD-10-CM | POA: Diagnosis not present

## 2019-09-12 DIAGNOSIS — E039 Hypothyroidism, unspecified: Secondary | ICD-10-CM | POA: Diagnosis not present

## 2019-09-12 DIAGNOSIS — Z13220 Encounter for screening for lipoid disorders: Secondary | ICD-10-CM | POA: Diagnosis not present

## 2019-09-12 DIAGNOSIS — Z0000 Encounter for general adult medical examination without abnormal findings: Secondary | ICD-10-CM | POA: Diagnosis not present

## 2019-09-12 DIAGNOSIS — M549 Dorsalgia, unspecified: Secondary | ICD-10-CM | POA: Diagnosis not present

## 2019-09-15 DIAGNOSIS — M542 Cervicalgia: Secondary | ICD-10-CM | POA: Diagnosis not present

## 2019-09-20 DIAGNOSIS — M542 Cervicalgia: Secondary | ICD-10-CM | POA: Diagnosis not present

## 2019-10-04 DIAGNOSIS — Z3040 Encounter for surveillance of contraceptives, unspecified: Secondary | ICD-10-CM | POA: Diagnosis not present

## 2019-10-04 DIAGNOSIS — Z1239 Encounter for other screening for malignant neoplasm of breast: Secondary | ICD-10-CM | POA: Diagnosis not present

## 2019-10-04 DIAGNOSIS — Z01419 Encounter for gynecological examination (general) (routine) without abnormal findings: Secondary | ICD-10-CM | POA: Diagnosis not present

## 2019-10-04 DIAGNOSIS — Z6826 Body mass index (BMI) 26.0-26.9, adult: Secondary | ICD-10-CM | POA: Diagnosis not present

## 2019-10-04 DIAGNOSIS — Z1231 Encounter for screening mammogram for malignant neoplasm of breast: Secondary | ICD-10-CM | POA: Diagnosis not present

## 2019-10-25 DIAGNOSIS — M542 Cervicalgia: Secondary | ICD-10-CM | POA: Diagnosis not present

## 2019-10-27 ENCOUNTER — Other Ambulatory Visit: Payer: Self-pay | Admitting: Surgery

## 2019-10-27 DIAGNOSIS — Z9889 Other specified postprocedural states: Secondary | ICD-10-CM

## 2019-11-02 ENCOUNTER — Ambulatory Visit
Admission: RE | Admit: 2019-11-02 | Discharge: 2019-11-02 | Disposition: A | Discharge status: Home / Self Care | Payer: Self-pay | Source: Ambulatory Visit | Attending: Surgery | Admitting: Surgery

## 2019-11-02 DIAGNOSIS — Z9889 Other specified postprocedural states: Secondary | ICD-10-CM

## 2019-11-24 DIAGNOSIS — M542 Cervicalgia: Secondary | ICD-10-CM | POA: Diagnosis not present

## 2019-11-24 DIAGNOSIS — S161XXD Strain of muscle, fascia and tendon at neck level, subsequent encounter: Secondary | ICD-10-CM | POA: Diagnosis not present

## 2019-12-01 DIAGNOSIS — M542 Cervicalgia: Secondary | ICD-10-CM | POA: Diagnosis not present

## 2019-12-01 DIAGNOSIS — S161XXD Strain of muscle, fascia and tendon at neck level, subsequent encounter: Secondary | ICD-10-CM | POA: Diagnosis not present

## 2019-12-06 DIAGNOSIS — S161XXD Strain of muscle, fascia and tendon at neck level, subsequent encounter: Secondary | ICD-10-CM | POA: Diagnosis not present

## 2019-12-06 DIAGNOSIS — M542 Cervicalgia: Secondary | ICD-10-CM | POA: Diagnosis not present

## 2019-12-08 DIAGNOSIS — S161XXD Strain of muscle, fascia and tendon at neck level, subsequent encounter: Secondary | ICD-10-CM | POA: Diagnosis not present

## 2019-12-08 DIAGNOSIS — M542 Cervicalgia: Secondary | ICD-10-CM | POA: Diagnosis not present

## 2020-01-31 DIAGNOSIS — E890 Postprocedural hypothyroidism: Secondary | ICD-10-CM | POA: Diagnosis not present

## 2020-02-07 DIAGNOSIS — E890 Postprocedural hypothyroidism: Secondary | ICD-10-CM | POA: Diagnosis not present

## 2020-03-24 DIAGNOSIS — M79642 Pain in left hand: Secondary | ICD-10-CM | POA: Diagnosis not present

## 2020-04-20 DIAGNOSIS — Z23 Encounter for immunization: Secondary | ICD-10-CM | POA: Diagnosis not present

## 2020-08-20 DIAGNOSIS — Z20828 Contact with and (suspected) exposure to other viral communicable diseases: Secondary | ICD-10-CM | POA: Diagnosis not present

## 2020-09-18 DIAGNOSIS — Z0000 Encounter for general adult medical examination without abnormal findings: Secondary | ICD-10-CM | POA: Diagnosis not present

## 2020-09-18 DIAGNOSIS — Z13220 Encounter for screening for lipoid disorders: Secondary | ICD-10-CM | POA: Diagnosis not present

## 2020-10-29 DIAGNOSIS — Z1231 Encounter for screening mammogram for malignant neoplasm of breast: Secondary | ICD-10-CM | POA: Diagnosis not present

## 2020-10-29 DIAGNOSIS — Z3040 Encounter for surveillance of contraceptives, unspecified: Secondary | ICD-10-CM | POA: Diagnosis not present

## 2020-10-29 DIAGNOSIS — Z01419 Encounter for gynecological examination (general) (routine) without abnormal findings: Secondary | ICD-10-CM | POA: Diagnosis not present

## 2020-11-14 DIAGNOSIS — L814 Other melanin hyperpigmentation: Secondary | ICD-10-CM | POA: Diagnosis not present

## 2020-11-14 DIAGNOSIS — D2372 Other benign neoplasm of skin of left lower limb, including hip: Secondary | ICD-10-CM | POA: Diagnosis not present

## 2020-11-14 DIAGNOSIS — D225 Melanocytic nevi of trunk: Secondary | ICD-10-CM | POA: Diagnosis not present

## 2020-11-14 DIAGNOSIS — L578 Other skin changes due to chronic exposure to nonionizing radiation: Secondary | ICD-10-CM | POA: Diagnosis not present

## 2021-01-06 DIAGNOSIS — M25572 Pain in left ankle and joints of left foot: Secondary | ICD-10-CM | POA: Diagnosis not present

## 2021-01-22 DIAGNOSIS — E890 Postprocedural hypothyroidism: Secondary | ICD-10-CM | POA: Diagnosis not present

## 2021-01-23 DIAGNOSIS — E890 Postprocedural hypothyroidism: Secondary | ICD-10-CM | POA: Diagnosis not present

## 2021-03-04 DIAGNOSIS — Z111 Encounter for screening for respiratory tuberculosis: Secondary | ICD-10-CM | POA: Diagnosis not present

## 2021-05-13 DIAGNOSIS — E890 Postprocedural hypothyroidism: Secondary | ICD-10-CM | POA: Diagnosis not present

## 2021-11-15 ENCOUNTER — Other Ambulatory Visit: Payer: Self-pay | Admitting: Orthopedic Surgery

## 2021-12-30 ENCOUNTER — Other Ambulatory Visit: Payer: Self-pay

## 2021-12-30 ENCOUNTER — Encounter (HOSPITAL_BASED_OUTPATIENT_CLINIC_OR_DEPARTMENT_OTHER): Payer: Self-pay | Admitting: Orthopedic Surgery

## 2022-01-04 IMAGING — US US THYROID
1 series · 14 of 16 positions shown · non-contrast
Comparison: Preop 09/14/2014

CLINICAL DATA: Anterior neck pain, previous thyroidectomy

EXAM:
THYROID ULTRASOUND
TECHNIQUE: Ultrasound examination of the thyroidectomy bed and adjacent soft
tissues was performed.

[Series 1: us thyroid · 0.06mm/px · 14 of 16 slices shown]
[im 1/16]
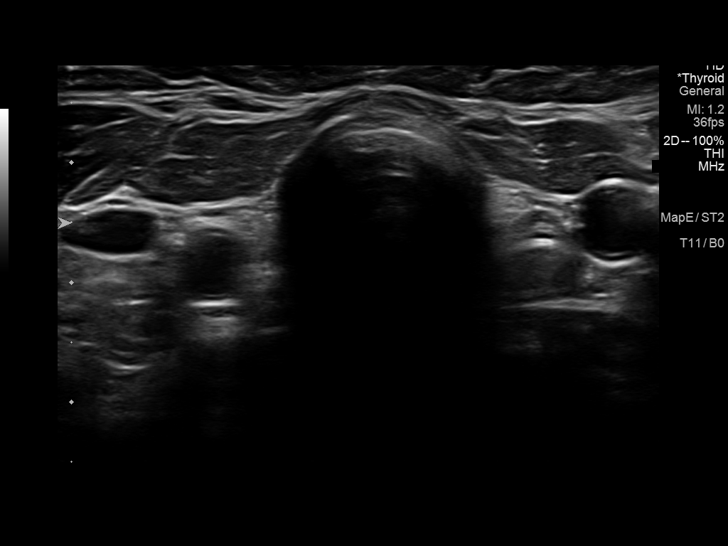
[im 2/16]
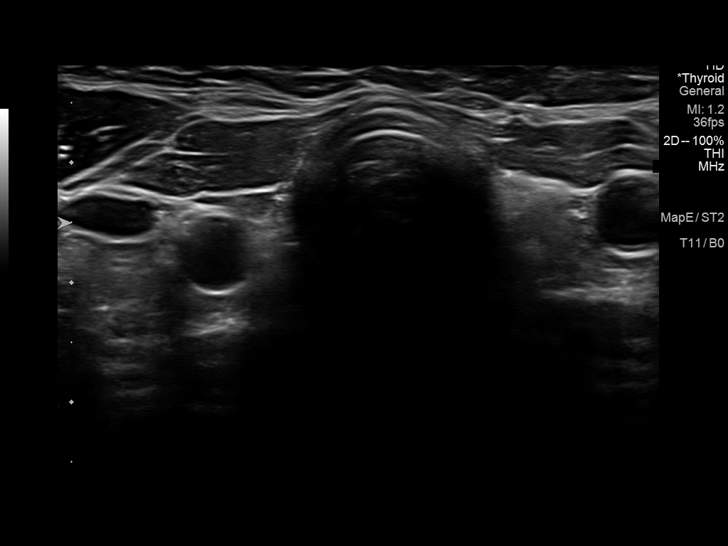
[im 3/16]
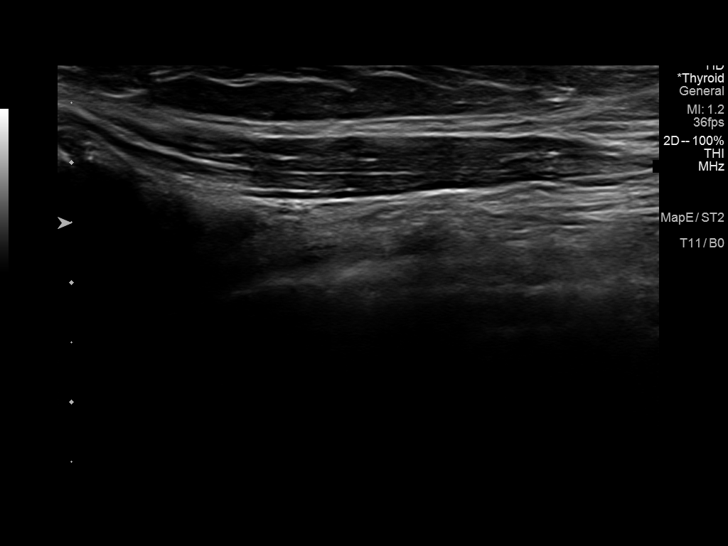
[im 5/16]
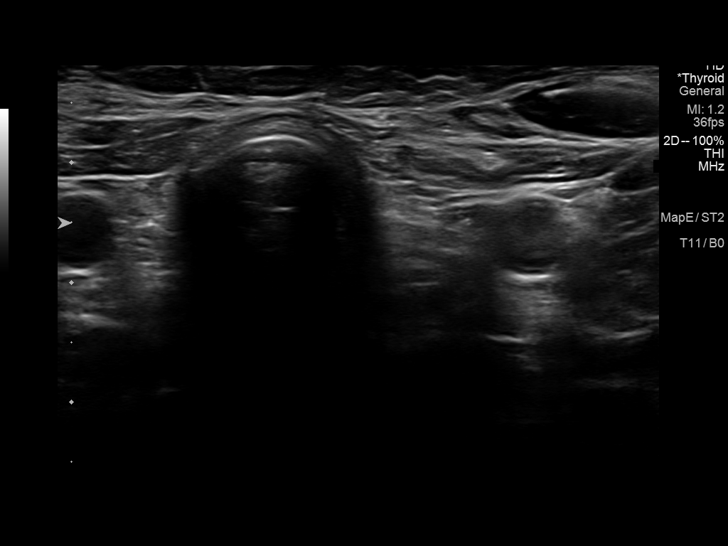
[im 6/16]
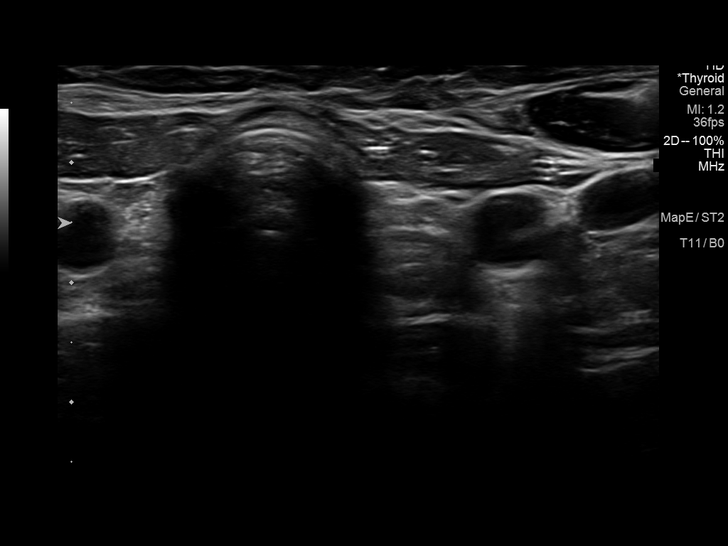
[im 7/16]
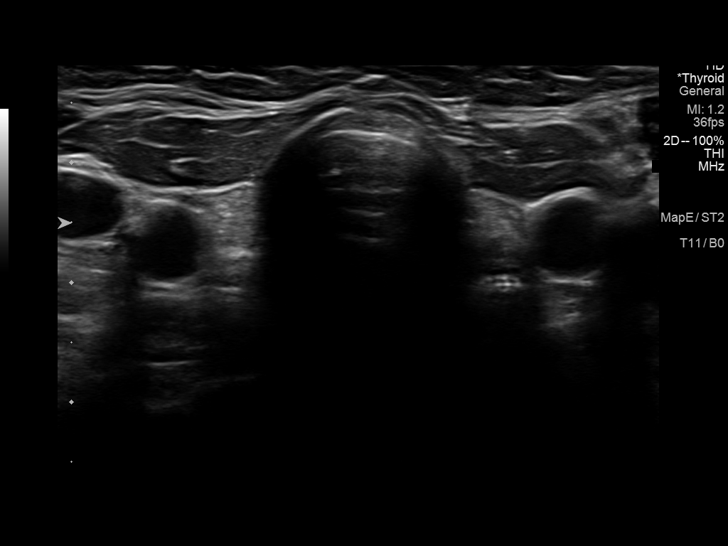
[im 8/16]
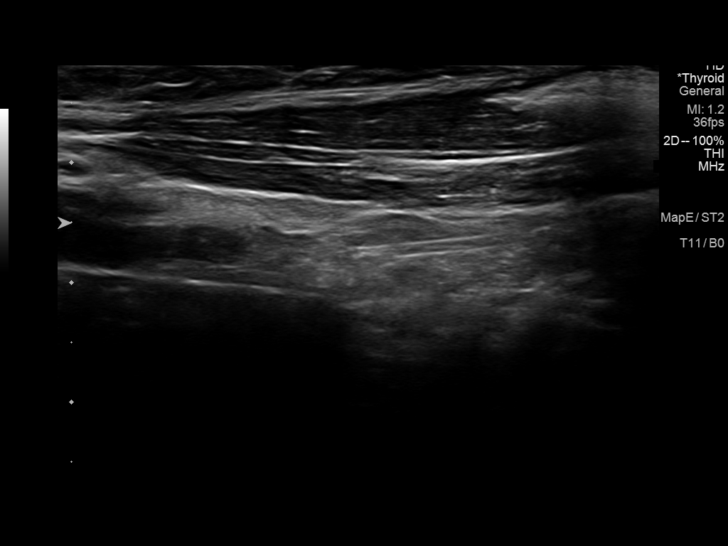
[im 9/16]
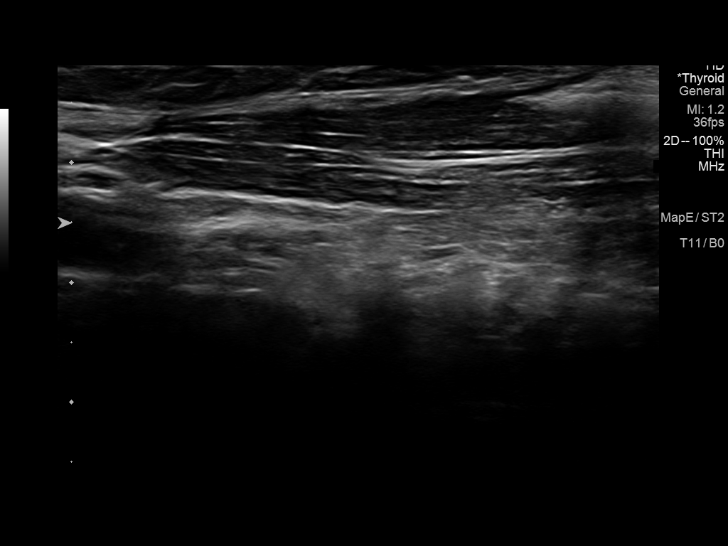
[im 10/16]
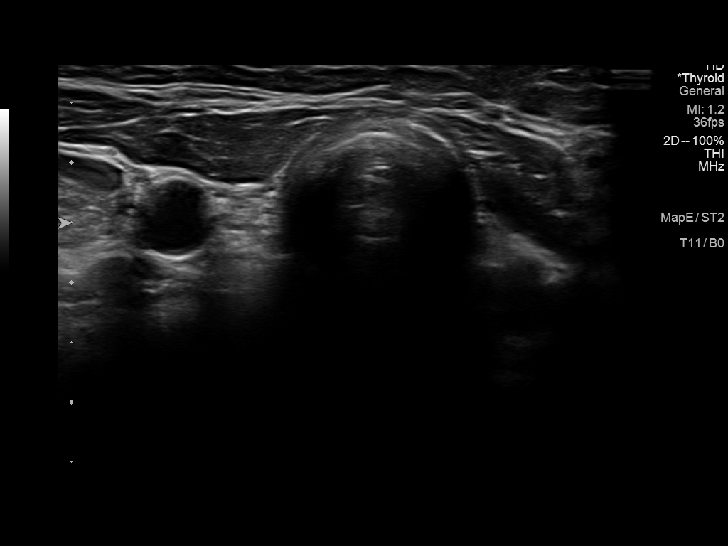
[im 11/16]
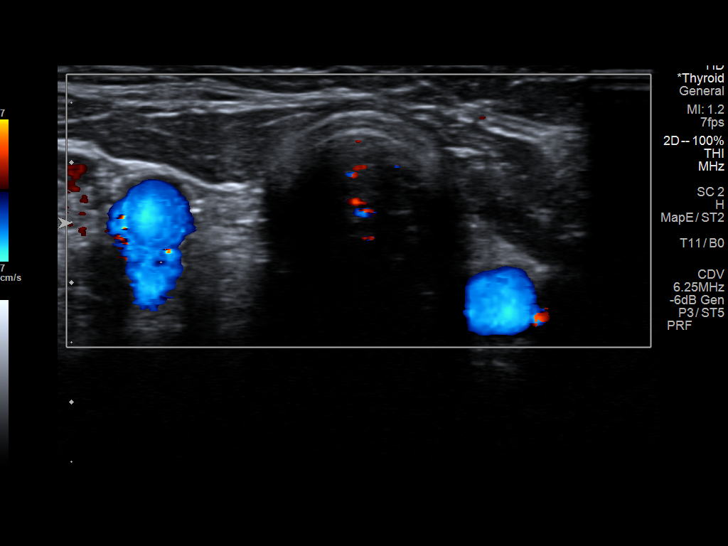
[im 13/16]
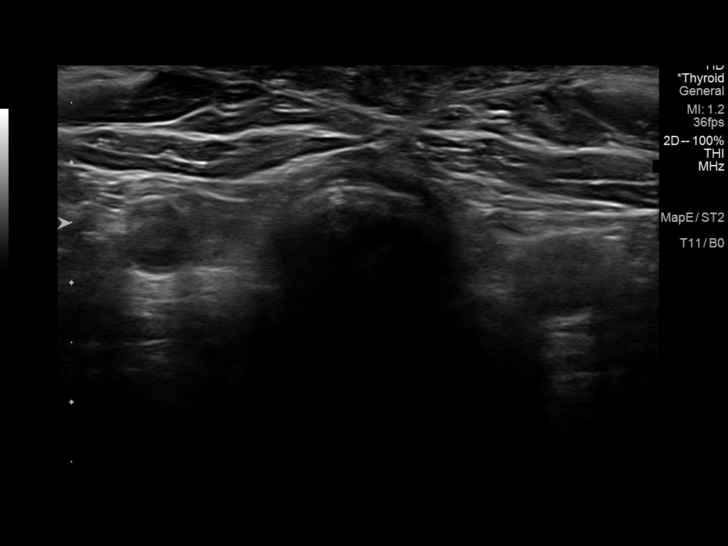
[im 14/16]
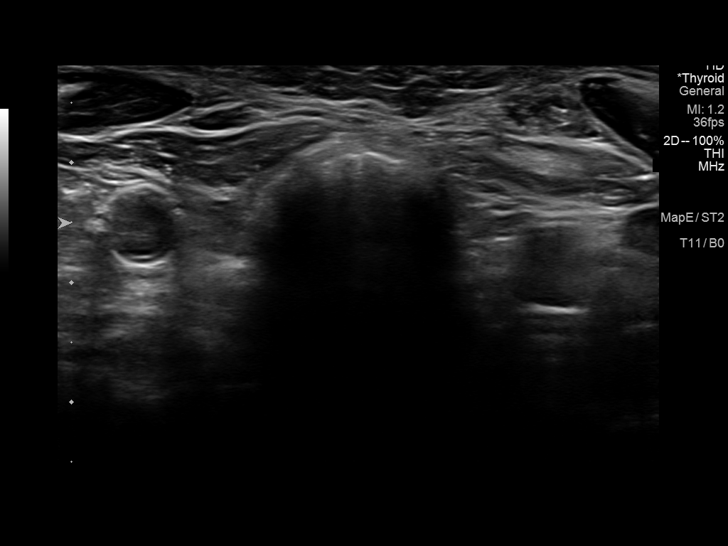
[im 15/16]
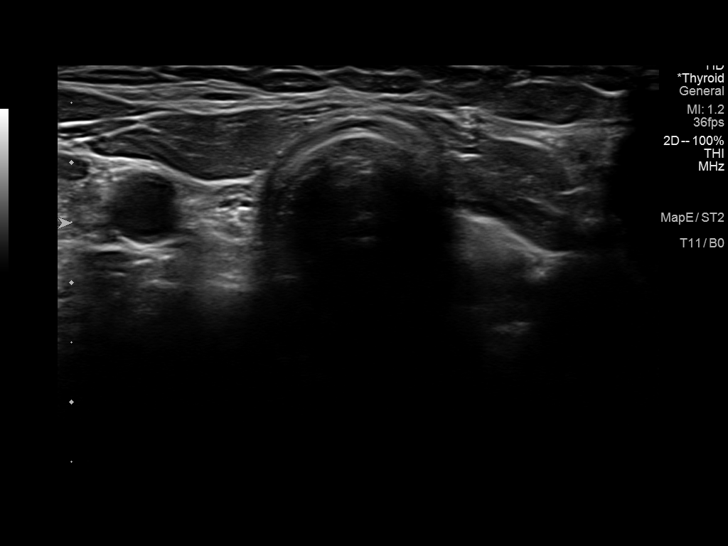
[im 16/16]
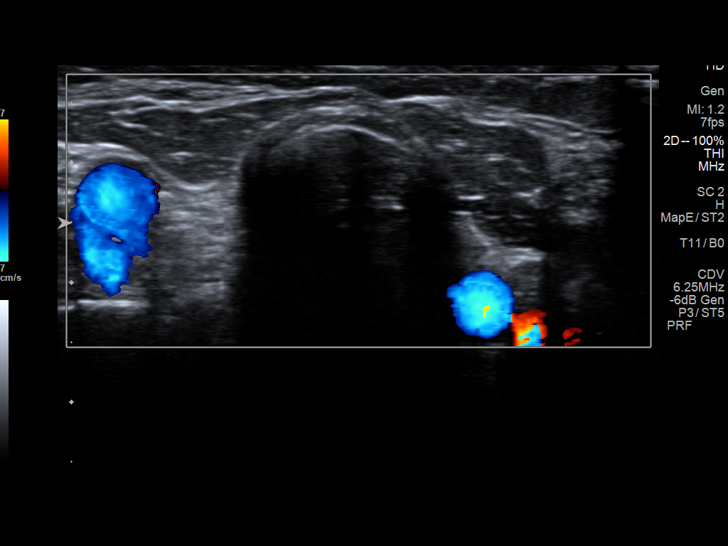

[14 of 16 positions shown; findings below may reference images not displayed]

FINDINGS: Parenchymal Echotexture: None identified

Isthmus: Surgically absent

Right lobe: Surgically absent

Left lobe: Surgically absent

_________________________________________________________

Estimated total number of nodules >/= 1 cm: 0

Number of spongiform nodules >/=  2 cm not described below (TR1): 0

Number of mixed cystic and solid nodules >/= 1.5 cm not described
below (TR2): 0

_________________________________________________________

No discrete nodules are seen within the thyroidectomy bed. No
regional adenopathy identified.
IMPRESSION: Negative for residual/recurrent tissue post thyroidectomy.

The above is in keeping with the ACR TI-RADS recommendations - [HOSPITAL] 2633;[DATE].

## 2022-01-07 ENCOUNTER — Other Ambulatory Visit: Payer: Self-pay

## 2022-01-07 ENCOUNTER — Ambulatory Visit (HOSPITAL_BASED_OUTPATIENT_CLINIC_OR_DEPARTMENT_OTHER)
Admission: RE | Admit: 2022-01-07 | Discharge: 2022-01-07 | Disposition: A | Discharge status: Home / Self Care | Payer: BC Managed Care – PPO | Attending: Orthopedic Surgery | Admitting: Orthopedic Surgery

## 2022-01-07 ENCOUNTER — Encounter (HOSPITAL_BASED_OUTPATIENT_CLINIC_OR_DEPARTMENT_OTHER)
Admission: RE | Disposition: A | Discharge status: Home / Self Care | Payer: Self-pay | Source: Home / Self Care | Attending: Orthopedic Surgery

## 2022-01-07 ENCOUNTER — Ambulatory Visit (HOSPITAL_BASED_OUTPATIENT_CLINIC_OR_DEPARTMENT_OTHER): Payer: BC Managed Care – PPO | Admitting: Certified Registered"

## 2022-01-07 ENCOUNTER — Encounter (HOSPITAL_BASED_OUTPATIENT_CLINIC_OR_DEPARTMENT_OTHER): Payer: Self-pay | Admitting: Orthopedic Surgery

## 2022-01-07 DIAGNOSIS — D1809 Hemangioma of other sites: Secondary | ICD-10-CM | POA: Diagnosis present

## 2022-01-07 DIAGNOSIS — E039 Hypothyroidism, unspecified: Secondary | ICD-10-CM | POA: Diagnosis not present

## 2022-01-07 DIAGNOSIS — Z01818 Encounter for other preprocedural examination: Secondary | ICD-10-CM

## 2022-01-07 HISTORY — PX: EXCISION METACARPAL MASS: SHX6372

## 2022-01-07 LAB — POCT PREGNANCY, URINE: Preg Test, Ur: NEGATIVE

## 2022-01-07 SURGERY — EXCISION METACARPAL MASS
Anesthesia: General | Laterality: Right

## 2022-01-07 MED ORDER — PROPOFOL 10 MG/ML IV BOLUS
INTRAVENOUS | Status: AC
  Filled 2022-01-07: qty 20

## 2022-01-07 MED ORDER — MIDAZOLAM HCL 2 MG/2ML IJ SOLN
INTRAMUSCULAR | Status: DC | PRN
  Administered 2022-01-07: 2 mg via INTRAVENOUS

## 2022-01-07 MED ORDER — BUPIVACAINE HCL (PF) 0.25 % IJ SOLN
INTRAMUSCULAR | Status: AC
  Filled 2022-01-07: qty 30

## 2022-01-07 MED ORDER — MIDAZOLAM HCL 2 MG/2ML IJ SOLN
INTRAMUSCULAR | Status: AC
  Filled 2022-01-07: qty 2

## 2022-01-07 MED ORDER — BUPIVACAINE HCL (PF) 0.25 % IJ SOLN
INTRAMUSCULAR | Status: DC | PRN
  Administered 2022-01-07: 9 mL

## 2022-01-07 MED ORDER — VANCOMYCIN HCL IN DEXTROSE 1-5 GM/200ML-% IV SOLN
1000.0000 mg | INTRAVENOUS | Status: AC
  Administered 2022-01-07: 1000 mg via INTRAVENOUS

## 2022-01-07 MED ORDER — ONDANSETRON HCL 4 MG/2ML IJ SOLN
INTRAMUSCULAR | Status: DC | PRN
  Administered 2022-01-07: 4 mg via INTRAVENOUS

## 2022-01-07 MED ORDER — FENTANYL CITRATE (PF) 100 MCG/2ML IJ SOLN
INTRAMUSCULAR | Status: AC
  Filled 2022-01-07: qty 2

## 2022-01-07 MED ORDER — KETOROLAC TROMETHAMINE 30 MG/ML IJ SOLN
30.0000 mg | Freq: Once | INTRAMUSCULAR | Status: AC
  Administered 2022-01-07: 30 mg via INTRAVENOUS

## 2022-01-07 MED ORDER — ACETAMINOPHEN 10 MG/ML IV SOLN: 1000.0000 mg | Freq: Once | INTRAVENOUS | Status: DC | PRN

## 2022-01-07 MED ORDER — VANCOMYCIN HCL IN DEXTROSE 1-5 GM/200ML-% IV SOLN
INTRAVENOUS | Status: AC
  Filled 2022-01-07: qty 200

## 2022-01-07 MED ORDER — LACTATED RINGERS IV SOLN: INTRAVENOUS | Status: DC | PRN

## 2022-01-07 MED ORDER — DEXAMETHASONE SODIUM PHOSPHATE 10 MG/ML IJ SOLN
INTRAMUSCULAR | Status: DC | PRN
  Administered 2022-01-07: 10 mg via INTRAVENOUS

## 2022-01-07 MED ORDER — DEXAMETHASONE SODIUM PHOSPHATE 10 MG/ML IJ SOLN
INTRAMUSCULAR | Status: AC
  Filled 2022-01-07: qty 1

## 2022-01-07 MED ORDER — AMISULPRIDE (ANTIEMETIC) 5 MG/2ML IV SOLN: 10.0000 mg | Freq: Once | INTRAVENOUS | Status: DC | PRN

## 2022-01-07 MED ORDER — ONDANSETRON HCL 4 MG/2ML IJ SOLN
INTRAMUSCULAR | Status: AC
  Filled 2022-01-07: qty 4

## 2022-01-07 MED ORDER — FENTANYL CITRATE (PF) 100 MCG/2ML IJ SOLN
INTRAMUSCULAR | Status: DC | PRN
Start: 2022-01-07 — End: 2022-01-07
  Administered 2022-01-07 (×2): 50 ug via INTRAVENOUS

## 2022-01-07 MED ORDER — OXYCODONE HCL 5 MG PO TABS
5.0000 mg | ORAL_TABLET | Freq: Once | ORAL | Status: AC | PRN
  Administered 2022-01-07: 5 mg via ORAL

## 2022-01-07 MED ORDER — KETOROLAC TROMETHAMINE 30 MG/ML IJ SOLN
INTRAMUSCULAR | Status: AC
Start: 2022-01-07 — End: ?
  Filled 2022-01-07: qty 1

## 2022-01-07 MED ORDER — FENTANYL CITRATE (PF) 100 MCG/2ML IJ SOLN: 25.0000 ug | INTRAMUSCULAR | Status: DC | PRN

## 2022-01-07 MED ORDER — OXYCODONE HCL 5 MG/5ML PO SOLN: 5.0000 mg | Freq: Once | ORAL | Status: AC | PRN

## 2022-01-07 MED ORDER — HYDROCODONE-ACETAMINOPHEN 5-325 MG PO TABS: ORAL_TABLET | ORAL | 0 refills | Status: AC

## 2022-01-07 MED ORDER — PROPOFOL 10 MG/ML IV BOLUS
INTRAVENOUS | Status: DC | PRN
  Administered 2022-01-07: 150 mg via INTRAVENOUS
  Administered 2022-01-07: 200 mg via INTRAVENOUS

## 2022-01-07 MED ORDER — DEXMEDETOMIDINE HCL IN NACL 200 MCG/50ML IV SOLN
INTRAVENOUS | Status: DC | PRN
  Administered 2022-01-07: 12 ug via INTRAVENOUS

## 2022-01-07 MED ORDER — LACTATED RINGERS IV SOLN: INTRAVENOUS | Status: DC

## 2022-01-07 MED ORDER — LIDOCAINE HCL (CARDIAC) PF 100 MG/5ML IV SOSY
PREFILLED_SYRINGE | INTRAVENOUS | Status: DC | PRN
  Administered 2022-01-07: 100 mg via INTRAVENOUS

## 2022-01-07 MED ORDER — OXYCODONE HCL 5 MG PO TABS
ORAL_TABLET | ORAL | Status: AC
  Filled 2022-01-07: qty 1

## 2022-01-07 SURGICAL SUPPLY — 56 items
APL PRP STRL LF DISP 70% ISPRP (MISCELLANEOUS) ×1
APL SKNCLS STERI-STRIP NONHPOA (GAUZE/BANDAGES/DRESSINGS)
BANDAGE GAUZE 1X75IN STRL (MISCELLANEOUS) IMPLANT
BENZOIN TINCTURE PRP APPL 2/3 (GAUZE/BANDAGES/DRESSINGS) IMPLANT
BLADE MINI RND TIP GREEN BEAV (BLADE) IMPLANT
BLADE SURG 15 STRL LF DISP TIS (BLADE) ×2 IMPLANT
BLADE SURG 15 STRL SS (BLADE) ×4
BNDG CMPR 5X2 CHSV 1 LYR STRL (GAUZE/BANDAGES/DRESSINGS)
BNDG CMPR 75X11 PLY HI ABS (MISCELLANEOUS)
BNDG CMPR 75X21 PLY HI ABS (MISCELLANEOUS)
BNDG CMPR 9X4 STRL LF SNTH (GAUZE/BANDAGES/DRESSINGS) ×1
BNDG COHESIVE 1X5 TAN STRL LF (GAUZE/BANDAGES/DRESSINGS) IMPLANT
BNDG COHESIVE 2X5 TAN ST LF (GAUZE/BANDAGES/DRESSINGS) IMPLANT
BNDG ELASTIC 2X5.8 VLCR STR LF (GAUZE/BANDAGES/DRESSINGS) IMPLANT
BNDG ELASTIC 3X5.8 VLCR STR LF (GAUZE/BANDAGES/DRESSINGS) IMPLANT
BNDG ESMARK 4X9 LF (GAUZE/BANDAGES/DRESSINGS) ×1 IMPLANT
BNDG GAUZE 1X75IN STRL (MISCELLANEOUS)
BNDG GAUZE DERMACEA FLUFF (GAUZE/BANDAGES/DRESSINGS)
BNDG GAUZE DERMACEA FLUFF 4 (GAUZE/BANDAGES/DRESSINGS) IMPLANT
BNDG GZE DERMACEA 4 6PLY (GAUZE/BANDAGES/DRESSINGS)
BNDG PLASTER X FAST 3X3 WHT LF (CAST SUPPLIES) IMPLANT
BNDG PLSTR 9X3 FST ST WHT (CAST SUPPLIES)
CHLORAPREP W/TINT 26 (MISCELLANEOUS) ×2 IMPLANT
CORD BIPOLAR FORCEPS 12FT (ELECTRODE) ×2 IMPLANT
COVER BACK TABLE 60X90IN (DRAPES) ×2 IMPLANT
COVER MAYO STAND STRL (DRAPES) ×2 IMPLANT
CUFF TOURN SGL QUICK 18X4 (TOURNIQUET CUFF) ×2 IMPLANT
DRAPE EXTREMITY T 121X128X90 (DISPOSABLE) ×2 IMPLANT
DRAPE SURG 17X23 STRL (DRAPES) ×2 IMPLANT
GAUZE SPONGE 4X4 12PLY STRL (GAUZE/BANDAGES/DRESSINGS) ×2 IMPLANT
GAUZE STRETCH 2X75IN STRL (MISCELLANEOUS) IMPLANT
GAUZE XEROFORM 1X8 LF (GAUZE/BANDAGES/DRESSINGS) ×2 IMPLANT
GLOVE BIO SURGEON STRL SZ7.5 (GLOVE) ×2 IMPLANT
GLOVE BIOGEL PI IND STRL 8 (GLOVE) ×1 IMPLANT
GLOVE BIOGEL PI INDICATOR 8 (GLOVE) ×1
GOWN STRL REUS W/ TWL LRG LVL3 (GOWN DISPOSABLE) ×1 IMPLANT
GOWN STRL REUS W/TWL LRG LVL3 (GOWN DISPOSABLE) ×2
GOWN STRL REUS W/TWL XL LVL3 (GOWN DISPOSABLE) ×2 IMPLANT
NDL HYPO 25X1 1.5 SAFETY (NEEDLE) ×1 IMPLANT
NEEDLE HYPO 25X1 1.5 SAFETY (NEEDLE) ×2 IMPLANT
NS IRRIG 1000ML POUR BTL (IV SOLUTION) ×2 IMPLANT
PACK BASIN DAY SURGERY FS (CUSTOM PROCEDURE TRAY) ×2 IMPLANT
PAD CAST 3X4 CTTN HI CHSV (CAST SUPPLIES) IMPLANT
PAD CAST 4YDX4 CTTN HI CHSV (CAST SUPPLIES) IMPLANT
PADDING CAST ABS 4INX4YD NS (CAST SUPPLIES) ×1
PADDING CAST ABS COTTON 4X4 ST (CAST SUPPLIES) ×1 IMPLANT
PADDING CAST COTTON 3X4 STRL (CAST SUPPLIES)
PADDING CAST COTTON 4X4 STRL (CAST SUPPLIES)
STOCKINETTE 4X48 STRL (DRAPES) ×2 IMPLANT
STRIP CLOSURE SKIN 1/2X4 (GAUZE/BANDAGES/DRESSINGS) IMPLANT
SUT ETHILON 3 0 PS 1 (SUTURE) IMPLANT
SUT ETHILON 4 0 PS 2 18 (SUTURE) ×2 IMPLANT
SYR BULB EAR ULCER 3OZ GRN STR (SYRINGE) ×2 IMPLANT
SYR CONTROL 10ML LL (SYRINGE) ×2 IMPLANT
TOWEL GREEN STERILE FF (TOWEL DISPOSABLE) ×4 IMPLANT
UNDERPAD 30X36 HEAVY ABSORB (UNDERPADS AND DIAPERS) ×2 IMPLANT

## 2022-01-07 NOTE — Anesthesia Procedure Notes (Signed)
Procedure Name: LMA Insertion Date/Time: 01/07/2022 1:14 PM  Performed by: Verita Lamb, CRNAPre-anesthesia Checklist: Patient identified, Emergency Drugs available, Suction available and Patient being monitored Patient Re-evaluated:Patient Re-evaluated prior to induction Oxygen Delivery Method: Circle system utilized Preoxygenation: Pre-oxygenation with 100% oxygen Induction Type: IV induction Ventilation: Mask ventilation without difficulty LMA: LMA inserted LMA Size: 4.0 Number of attempts: 1 Airway Equipment and Method: Bite block Placement Confirmation: positive ETCO2 Tube secured with: Tape Dental Injury: Teeth and Oropharynx as per pre-operative assessment

## 2022-01-07 NOTE — Anesthesia Preprocedure Evaluation (Addendum)
Anesthesia Evaluation    Reviewed: Allergy & Precautions, Patient's Chart, lab work & pertinent test results  Airway Mallampati: II  TM Distance: >3 FB Neck ROM: Full    Dental no notable dental hx.    Pulmonary neg pulmonary ROS,    Pulmonary exam normal        Cardiovascular negative cardio ROS Normal cardiovascular exam     Neuro/Psych negative neurological ROS     GI/Hepatic negative GI ROS, (+)     substance abuse  ,   Endo/Other  Hypothyroidism   Renal/GU negative Renal ROS     Musculoskeletal negative musculoskeletal ROS (+)   Abdominal   Peds  Hematology negative hematology ROS (+)   Anesthesia Other Findings SUBUNGUAL MASS RIGHT THUMB  Reproductive/Obstetrics hcg negative                             Anesthesia Physical Anesthesia Plan  ASA: 2  Anesthesia Plan: General   Post-op Pain Management:    Induction: Intravenous  PONV Risk Score and Plan: 3 and Ondansetron, Dexamethasone, Midazolam and Treatment may vary due to age or medical condition  Airway Management Planned: LMA  Additional Equipment:   Intra-op Plan:   Post-operative Plan: Extubation in OR  Informed Consent: I have reviewed the patients History and Physical, chart, labs and discussed the procedure including the risks, benefits and alternatives for the proposed anesthesia with the patient or authorized representative who has indicated his/her understanding and acceptance.     Dental advisory given  Plan Discussed with: CRNA  Anesthesia Plan Comments:        Anesthesia Quick Evaluation

## 2022-01-07 NOTE — Anesthesia Postprocedure Evaluation (Signed)
Anesthesia Post Note  Patient: Shyana Kulakowski  Procedure(s) Performed: EXCISION SUBUNGUAL MASS (GLOMUS TUMOR) RIGHT THUMB (Right)     Patient location during evaluation: PACU Anesthesia Type: General Level of consciousness: awake Pain management: pain level controlled Vital Signs Assessment: post-procedure vital signs reviewed and stable Respiratory status: spontaneous breathing, nonlabored ventilation, respiratory function stable and patient connected to nasal cannula oxygen Cardiovascular status: blood pressure returned to baseline and stable Postop Assessment: no apparent nausea or vomiting Anesthetic complications: no   No notable events documented.  Last Vitals:  Vitals:   01/07/22 1415 01/07/22 1440  BP: 125/87 128/88  Pulse: 79 88  Resp: 12 20  Temp:  36.5 C  SpO2: 98% 100%    Last Pain:  Vitals:   01/07/22 1440  TempSrc: Oral  PainSc: 3                  Skyanne Welle P Kaytlin Burklow

## 2022-01-07 NOTE — Discharge Instructions (Addendum)
  Post Anesthesia Home Care Instructions  Activity: Get plenty of rest for the remainder of the day. A responsible individual must stay with you for 24 hours following the procedure.  For the next 24 hours, DO NOT: -Drive a car -Paediatric nurse -Drink alcoholic beverages -Take any medication unless instructed by your physician -Make any legal decisions or sign important papers.  Meals: Start with liquid foods such as gelatin or soup. Progress to regular foods as tolerated. Avoid greasy, spicy, heavy foods. If nausea and/or vomiting occur, drink only clear liquids until the nausea and/or vomiting subsides. Call your physician if vomiting continues.  Special Instructions/Symptoms: Your throat may feel dry or sore from the anesthesia or the breathing tube placed in your throat during surgery. If this causes discomfort, gargle with warm salt water. The discomfort should disappear within 24 hours.  If you had a scopolamine patch placed behind your ear for the management of post- operative nausea and/or vomiting:  1. The medication in the patch is effective for 72 hours, after which it should be removed.  Wrap patch in a tissue and discard in the trash. Wash hands thoroughly with soap and water. 2. You may remove the patch earlier than 72 hours if you experience unpleasant side effects which may include dry mouth, dizziness or visual disturbances. 3. Avoid touching the patch. Wash your hands with soap and water after contact with the patch.        [facility] Instructions Hand Surgery  Wound Care: Keep your hand elevated above the level of your heart.  Do not allow it to dangle by your side.  Keep the dressing dry and do not remove it unless your doctor advises you to do so.  He will usually change it at the time of your post-op visit.  Moving your fingers is advised to stimulate circulation but will depend on the site of your surgery.  If you have a splint applied, your doctor will advise  you regarding movement.  Activity: Do not drive or operate machinery today.  Rest today and then you may return to your normal activity and work as indicated by your physician.  Diet:  Drink liquids today or eat a light diet.  You may resume a regular diet tomorrow.    General expectations: Pain for two to three days. Fingers may become slightly swollen.  Call your doctor if any of the following occur: Severe pain not relieved by pain medication. Elevated temperature. Dressing soaked with blood. Inability to move fingers. White or bluish color to fingers.

## 2022-01-07 NOTE — H&P (Signed)
  Alejandra Stewart is an 46 y.o. female.   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  This is bothersome to her.  She wishes to have it removed.  Allergies:  Allergies  Allergen Reactions   Amoxicillin Hives   Sulfa Antibiotics Other (See Comments)    Bruises    Past Medical History:  Diagnosis Date   Multiple thyroid nodules     Past Surgical History:  Procedure Laterality Date   CHOLECYSTECTOMY     2007   DENTAL SURGERY     2010   DILATION AND CURETTAGE OF UTERUS     2008   THYROIDECTOMY  11/16/2014   THYROIDECTOMY N/A 11/16/2014   Procedure: TOTAL THYROIDECTOMY;  Surgeon: Armandina Gemma, MD;  Location: [facility];  Service: General;  Laterality: N/A;    Family History: Family History  Problem Relation Age of Onset   Diabetes type II Mother     Social History:   reports that she has never smoked. She has never used smokeless tobacco. She reports current alcohol use of about 3.0 standard drinks of alcohol per week. She reports that she does not use drugs.  Medications: Medications Prior to Admission  Medication Sig Dispense Refill   acetaminophen (TYLENOL) 500 MG tablet Take 500-1,000 mg by mouth every 6 (six) hours as needed for mild pain or moderate pain.      ibuprofen (ADVIL) 400 MG tablet Take 400 mg by mouth every 6 (six) hours as needed.     Norgestimate-Ethinyl Estradiol Triphasic (TRI-SPRINTEC) 0.18/0.215/0.25 MG-35 MCG tablet Take 1 tablet by mouth daily. 1 Package 11   SYNTHROID 100 MCG tablet Take 1 tablet (100 mcg total) by mouth daily. (Patient taking differently: Take 137 mcg by mouth daily.) 30 tablet 3    Results for orders placed or performed during the hospital encounter of 01/07/22 (from the past 48 hour(s))  Pregnancy, urine POC     Status: None   Collection Time: 01/07/22 11:54 AM  Result Value Ref Range   Preg Test, Ur NEGATIVE NEGATIVE    Comment:        THE SENSITIVITY OF THIS METHODOLOGY IS >24  mIU/mL     No results found.    Blood pressure 133/88, pulse 80, temperature 98.9 F (37.2 C), temperature source Oral, resp. rate 16, height 5' 6"  (1.676 m), weight 72.3 kg, last menstrual period 12/25/2021, SpO2 100 %.  General appearance: alert, cooperative, and appears stated age Head: Normocephalic, without obvious abnormality, atraumatic Neck: supple, symmetrical, trachea midline Extremities: Intact sensation and capillary refill all digits.  +epl/fpl/io.  No wounds.  Pulses: 2+ and symmetric Skin: Skin color, texture, turgor normal. No rashes or lesions Neurologic: Grossly normal Incision/Wound: none  Assessment/Plan Right thumb glomus tumor.  Non operative and operative treatment options have been discussed with the patient and patient wishes to proceed with operative treatment. Risks, benefits, and alternatives of surgery have been discussed and the patient agrees with the plan of care.   Leanora Cover 01/07/2022, 12:14 PM

## 2022-01-07 NOTE — Transfer of Care (Signed)
Immediate Anesthesia Transfer of Care Note  Patient: Ellysia Char  Procedure(s) Performed: EXCISION SUBUNGUAL MASS (GLOMUS TUMOR) RIGHT THUMB (Right)  Patient Location: PACU  Anesthesia Type:General  Level of Consciousness: awake and patient cooperative  Airway & Oxygen Therapy: Patient Spontanous Breathing and Patient connected to face mask oxygen  Post-op Assessment: Report given to RN and Post -op Vital signs reviewed and stable  Post vital signs: Reviewed and stable  Last Vitals:  Vitals Value Taken Time  BP 86/59 01/07/22 1348  Temp    Pulse 69 01/07/22 1349  Resp 22 01/07/22 1349  SpO2 98 % 01/07/22 1349  Vitals shown include unvalidated device data.  Last Pain:  Vitals:   01/07/22 1203  TempSrc: Oral  PainSc: 0-No pain      Patients Stated Pain Goal: 1 (02/77/41 2878)  Complications: No notable events documented.

## 2022-01-07 NOTE — Op Note (Signed)
NAME: Alejandra Stewart MEDICAL RECORD NO: [id number] DATE OF BIRTH: [birth year] FACILITY: [facility] LOCATION: [facility] PHYSICIAN: Tennis Must, MD   OPERATIVE REPORT   DATE OF PROCEDURE: 01/07/22    PREOPERATIVE DIAGNOSIS: Right thumb subungual glomus tumor   POSTOPERATIVE DIAGNOSIS: Right thumb subungual glomus tumor   PROCEDURE: 1.  Right thumb removal of nail plate 2 right thumb excision of glomus tumor, 5 mm   SURGEON:  Leanora Cover, M.D.   ASSISTANT: none   ANESTHESIA:  General   INTRAVENOUS FLUIDS:  Per anesthesia flow sheet.   ESTIMATED BLOOD LOSS:  Minimal.   COMPLICATIONS:  None.   SPECIMENS: Right thumb mass to pathology   TOURNIQUET TIME:    Total Tourniquet Time Documented: Upper Arm (Right) - 22 minutes Total: Upper Arm (Right) - 22 minutes    DISPOSITION:  Stable to PACU.   INDICATIONS: 46 year old female with glomus tumor of right thumb.  This been shown on MRI.  She wishes to have it removed risks, benefits and alternatives of surgery were discussed including the risks of blood loss, infection, damage to nerves, vessels, tendons, ligaments, bone for surgery, need for additional surgery, complications with wound healing, continued pain, stiffness, , recurrence.  She voiced understanding of these risks and elected to proceed.  OPERATIVE COURSE:  After being identified preoperatively by myself,  the patient and I agreed on the procedure and site of the procedure.  The surgical site was marked.  Surgical consent had been signed. Preoperative IV antibiotic prophylaxis was given. She was transferred to the operating room and placed on the operating table in supine position with the Right upper extremity on an arm board.  General anesthesia was induced by the anesthesiologist.  Right upper extremity was prepped and draped in normal sterile orthopedic fashion.  A surgical pause was performed between the surgeons, anesthesia, and operating room staff  and all were in agreement as to the patient, procedure, and site of procedure.  Tourniquet at the proximal aspect of the extremity was inflated to 250 mmHg after exsanguination of the arm with an Esmarch bandage.  The nail plate was removed with a freer elevator.  A incision was made over the mass through the nailbed.  The mass was easily identified.  It was bulbous and pearly in nature.  It was carefully freed up from the undersurface of the nailbed using a Kleinert Coots.  It was freed up from surrounding nailbed and the bone underneath.  It was removed and sent to pathology for examination.  It measured 5 mm in diameter.  The area was examined and no remaining mass was noted.  The wound was closed with a 6-0 chromic suture in an interrupted fashion.  A piece of Xeroform was placed in the nail fold and the area dressed with sterile Xeroform and 4 x 4 and wrapped with a Coban dressing lightly.  An AlumaFoam splint was placed and wrapped lightly with Coban dressing.  The tourniquet was deflated at 22 minutes.  Fingertips were pink with brisk capillary refill after deflation of tourniquet.  The operative  drapes were broken down.  The patient was awoken from anesthesia safely.  She was transferred back to the stretcher and taken to PACU in stable condition.  I will see her back in the office in 1 week for postoperative followup.  I will give her a prescription for Norco 5/325 1-2 tabs PO q6 hours prn pain, dispense # 15.   Leanora Cover, MD Electronically  signed, 01/07/22

## 2022-01-08 ENCOUNTER — Encounter (HOSPITAL_BASED_OUTPATIENT_CLINIC_OR_DEPARTMENT_OTHER): Payer: Self-pay | Admitting: Orthopedic Surgery

## 2022-01-09 LAB — SURGICAL PATHOLOGY

## 2022-05-13 ENCOUNTER — Other Ambulatory Visit: Payer: Self-pay | Admitting: Family Medicine

## 2022-05-13 DIAGNOSIS — R160 Hepatomegaly, not elsewhere classified: Secondary | ICD-10-CM

## 2022-05-28 ENCOUNTER — Ambulatory Visit
Admission: RE | Admit: 2022-05-28 | Discharge: 2022-05-28 | Disposition: A | Discharge status: Home / Self Care | Payer: BC Managed Care – PPO | Source: Ambulatory Visit | Attending: Family Medicine | Admitting: Family Medicine

## 2022-05-28 DIAGNOSIS — R160 Hepatomegaly, not elsewhere classified: Secondary | ICD-10-CM

## 2023-01-27 ENCOUNTER — Other Ambulatory Visit: Payer: Self-pay | Admitting: Family Medicine

## 2023-01-27 DIAGNOSIS — Z136 Encounter for screening for cardiovascular disorders: Secondary | ICD-10-CM

## 2023-02-13 ENCOUNTER — Other Ambulatory Visit: Payer: Self-pay | Admitting: Family Medicine

## 2023-02-13 ENCOUNTER — Ambulatory Visit
Admission: RE | Admit: 2023-02-13 | Discharge: 2023-02-13 | Disposition: A | Discharge status: Home / Self Care | Payer: BC Managed Care – PPO | Source: Ambulatory Visit | Attending: Family Medicine | Admitting: Family Medicine

## 2023-02-13 DIAGNOSIS — Z136 Encounter for screening for cardiovascular disorders: Secondary | ICD-10-CM
